# Patient Record
Sex: Female | Born: 1995 | Race: Black or African American | Hispanic: No | Marital: Single | Smoking: Never smoker
Health system: Southern US, Community
[De-identification: ages and names within clinical notes are randomized; demographics above are authoritative.]

## PROBLEM LIST (undated history)

## (undated) DIAGNOSIS — F419 Anxiety disorder, unspecified: Secondary | ICD-10-CM

## (undated) DIAGNOSIS — M329 Systemic lupus erythematosus, unspecified: Secondary | ICD-10-CM

## (undated) DIAGNOSIS — F329 Major depressive disorder, single episode, unspecified: Secondary | ICD-10-CM

## (undated) DIAGNOSIS — F32A Depression, unspecified: Secondary | ICD-10-CM

## (undated) DIAGNOSIS — L659 Nonscarring hair loss, unspecified: Secondary | ICD-10-CM

## (undated) HISTORY — DX: Depression, unspecified: F32.A

## (undated) HISTORY — DX: Nonscarring hair loss, unspecified: L65.9

## (undated) HISTORY — DX: Systemic lupus erythematosus, unspecified: M32.9

## (undated) HISTORY — DX: Major depressive disorder, single episode, unspecified: F32.9

## (undated) HISTORY — PX: ABSCESS DRAINAGE: SHX1119

## (undated) HISTORY — DX: Anxiety disorder, unspecified: F41.9

---
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
## 2013-01-23 ENCOUNTER — Ambulatory Visit (INDEPENDENT_AMBULATORY_CARE_PROVIDER_SITE_OTHER): Payer: BC Managed Care – PPO | Admitting: Physician Assistant

## 2013-01-23 VITALS — HR 86 | Temp 98.4°F | Resp 16

## 2013-01-23 DIAGNOSIS — Z3009 Encounter for other general counseling and advice on contraception: Secondary | ICD-10-CM

## 2013-01-23 MED ORDER — MEDROXYPROGESTERONE ACETATE 150 MG/ML IM SUSP
150.0000 mg | Freq: Once | INTRAMUSCULAR | Status: AC
  Administered 2013-01-23: 150 mg via INTRAMUSCULAR

## 2013-01-23 NOTE — Progress Notes (Signed)
   Patient ID: LARI LINSON MRN: [id number], DOB: 12-03-1995, 17 y.o. Date of Encounter: 01/23/2013, 3:09 PM  Primary Physician: Loreen Freud, DO  Chief Complaint: Here for Depo-Provera injection  17 y.o. year old female here for Depo-Provera injection. On time. Last injection was 10/29/12. Ok to give Depo-Provera injection. Next injection due on 04/10/13-04/24/13. This was a nursing only encounter. No provider/patient encounter occurred today.   Signed, Eula Listen, PA-C [facility] [place], [place] [address] [phone] 01/23/2013 3:09 PM

## 2013-04-19 ENCOUNTER — Ambulatory Visit (INDEPENDENT_AMBULATORY_CARE_PROVIDER_SITE_OTHER): Payer: BC Managed Care – PPO | Admitting: Physician Assistant

## 2013-04-19 DIAGNOSIS — Z309 Encounter for contraceptive management, unspecified: Secondary | ICD-10-CM

## 2013-04-19 DIAGNOSIS — IMO0001 Reserved for inherently not codable concepts without codable children: Secondary | ICD-10-CM

## 2013-04-19 MED ORDER — MEDROXYPROGESTERONE ACETATE 150 MG/ML IM SUSP
150.0000 mg | Freq: Once | INTRAMUSCULAR | Status: AC
  Administered 2013-04-19: 150 mg via INTRAMUSCULAR

## 2013-04-19 NOTE — Progress Notes (Signed)
   Patient ID: Samantha Davies MRN: [id number], DOB: Apr 12, 1995, 18 y.o. Date of Encounter: 04/19/2013, 5:13 PM  Primary Physician: Garnet Koyanagi, DO  Chief Complaint: Here for Depo-Provera injection  18 y.o. female here for Depo-Provera injection. On time. Last injection was 01/23/13. Ok to give Depo-Provera injection. Next injection due on 07/05/13-07/19/13. This was a nursing only encounter. No provider/patient encounter occurred today.   Signed, Christell Faith, MHS, PA-C [facility] [place], [place] [address] [facility] 04/19/2013 5:13 PM

## 2013-07-13 DIAGNOSIS — L932 Other local lupus erythematosus: Secondary | ICD-10-CM | POA: Insufficient documentation

## 2013-09-23 ENCOUNTER — Ambulatory Visit (INDEPENDENT_AMBULATORY_CARE_PROVIDER_SITE_OTHER): Payer: BC Managed Care – PPO | Admitting: Internal Medicine

## 2013-09-23 VITALS — BP 112/62 | HR 87 | Temp 98.3°F | Resp 16 | Ht 61.0 in | Wt 130.2 lb

## 2013-09-23 DIAGNOSIS — L089 Local infection of the skin and subcutaneous tissue, unspecified: Secondary | ICD-10-CM

## 2013-09-23 DIAGNOSIS — R591 Generalized enlarged lymph nodes: Secondary | ICD-10-CM

## 2013-09-23 DIAGNOSIS — L0201 Cutaneous abscess of face: Secondary | ICD-10-CM

## 2013-09-23 DIAGNOSIS — R21 Rash and other nonspecific skin eruption: Secondary | ICD-10-CM

## 2013-09-23 DIAGNOSIS — R599 Enlarged lymph nodes, unspecified: Secondary | ICD-10-CM

## 2013-09-23 DIAGNOSIS — M329 Systemic lupus erythematosus, unspecified: Secondary | ICD-10-CM

## 2013-09-23 DIAGNOSIS — L03211 Cellulitis of face: Secondary | ICD-10-CM

## 2013-09-23 LAB — CBC
HEMATOCRIT: 41.2 % (ref 36.0–46.0)
Hemoglobin: 13.5 g/dL (ref 12.0–15.0)
MCH: 27.6 pg (ref 26.0–34.0)
MCHC: 32.7 g/dL (ref 30.0–36.0)
MCV: 84.4 fL (ref 78.0–100.0)
Platelets: 249 10*3/uL (ref 150–400)
RBC: 4.89 MIL/uL (ref 3.87–5.11)
RDW: 14.2 % (ref 11.5–15.5)
WBC: 8.3 10*3/uL (ref 4.0–10.5)

## 2013-09-23 MED ORDER — MUPIROCIN 2 % EX OINT: 1.0000 "application " | TOPICAL_OINTMENT | Freq: Three times a day (TID) | CUTANEOUS | Status: DC

## 2013-09-23 MED ORDER — DOXYCYCLINE HYCLATE 100 MG PO TABS: 100.0000 mg | ORAL_TABLET | Freq: Two times a day (BID) | ORAL | Status: DC

## 2013-09-23 NOTE — Patient Instructions (Signed)
Cellulitis Cellulitis is an infection of the skin and the tissue beneath it. The infected area is usually red and tender. Cellulitis occurs most often in the arms and lower legs.  CAUSES  Cellulitis is caused by bacteria that enter the skin through cracks or cuts in the skin. The most common types of bacteria that cause cellulitis are Staphylococcus and Streptococcus. SYMPTOMS   Redness and warmth.  Swelling.  Tenderness or pain.  Fever. DIAGNOSIS  Your caregiver can usually determine what is wrong based on a physical exam. Blood tests may also be done. TREATMENT  Treatment usually involves taking an antibiotic medicine. HOME CARE INSTRUCTIONS   Take your antibiotics as directed. Finish them even if you start to feel better.  Keep the infected arm or leg elevated to reduce swelling.  Apply a warm cloth to the affected area up to 4 times per day to relieve pain.  Only take over-the-counter or prescription medicines for pain, discomfort, or fever as directed by your caregiver.  Keep all follow-up appointments as directed by your caregiver. SEEK MEDICAL CARE IF:   You notice red streaks coming from the infected area.  Your red area gets larger or turns dark in color.  Your bone or joint underneath the infected area becomes painful after the skin has healed.  Your infection returns in the same area or another area.  You notice a swollen bump in the infected area.  You develop new symptoms. SEEK IMMEDIATE MEDICAL CARE IF:   You have a fever.  You feel very sleepy.  You develop vomiting or diarrhea.  You have a general ill feeling (malaise) with muscle aches and pains. MAKE SURE YOU:   Understand these instructions.  Will watch your condition.  Will get help right away if you are not doing well or get worse. Document Released: 12/08/2004 Document Revised: 08/30/2011 Document Reviewed: 05/16/2011 [employer] Patient Information 2015 [employer]. This information is  not intended to replace advice given to you by your health care provider. Make sure you discuss any questions you have with your health care provider.  

## 2013-09-23 NOTE — Progress Notes (Signed)
   Subjective:    Patient ID: Samantha Davies, female    DOB: August 16, 1995, 18 y.o.   MRN: [id number]  HPI 18 yr old African American Female is here today with complaints of lumps and swelling inf her face that began 1 1/2 weeks ago. She states she has experienced some body aches  - lower back mainly for the past two to three days. She states she has lupus but presently not in a flare up stage. She states she contacted her lupus specialist at [place] with her current condition and was advised to go to the nearest Urgent Care to be evaluated. She denies any chest pain, shortness of breath or fever. Has past hx of complicated skin infections with hospitalization. Now has multiple sxs with tender lumps under chin, preauricular, and in her breasts. Also swelling comes and goes periorbital and other areas suggestive of revlimid edema. Has active tender pustule on right cheek. Has erythematous macular, non tender rash across breasts , face.    Review of Systems     Objective:   Physical Exam  Constitutional: She is oriented to person, place, and time. She appears well-nourished. She appears distressed.  HENT:  Head: Normocephalic.  Right Ear: External ear normal.  Left Ear: External ear normal.  Nose: Nose normal.  Mouth/Throat: Oropharynx is clear and moist.  Eyes: EOM are normal. Pupils are equal, round, and reactive to light.  Neck: Normal range of motion. Neck supple. No tracheal deviation present. No thyromegaly present.  Cardiovascular: Normal rate, regular rhythm and normal heart sounds.   Pulmonary/Chest: Effort normal and breath sounds normal. She exhibits no tenderness.  Abdominal: Soft. Bowel sounds are normal. She exhibits no mass. There is no hepatosplenomegaly. There is no tenderness.  Musculoskeletal: Normal range of motion.  Lymphadenopathy:       Head (right side): Submental, submandibular and preauricular adenopathy present. No posterior  auricular adenopathy present.       Head (left side): Submental and submandibular adenopathy present. No preauricular, no posterior auricular and no occipital adenopathy present.    She has cervical adenopathy.    She has no axillary adenopathy.       Right: No inguinal and no supraclavicular adenopathy present.       Left: No inguinal and no supraclavicular adenopathy present.  Left posterior submandibular is large , fixed tender  Neurological: She is alert and oriented to person, place, and time. No cranial nerve deficit. She exhibits normal muscle tone. Coordination normal.  Skin: Rash noted. There is erythema.  Psychiatric: She has a normal mood and affect. Her speech is normal and behavior is normal. Judgment and thought content normal. Cognition and memory are normal.  VS are normal Tender preauricular and submental nodes and submandiblar nodes Facial pustular acne and nodule right face.--this was cultured  Cbc machine broken/stat cbc sent out     Assessment & Plan:  Active SLE on revlimid and prednisone Unusual above shoulder adenopathy/rash/edema Pustular lesion right cheek/cultured Doxycycline 100mg  bid See [facility] this week if possible

## 2013-09-24 ENCOUNTER — Ambulatory Visit (INDEPENDENT_AMBULATORY_CARE_PROVIDER_SITE_OTHER): Payer: BC Managed Care – PPO | Admitting: Family Medicine

## 2013-09-24 VITALS — BP 100/74 | HR 86 | Temp 98.2°F | Resp 16 | Ht 61.0 in | Wt 129.8 lb

## 2013-09-24 DIAGNOSIS — Z79899 Other long term (current) drug therapy: Secondary | ICD-10-CM

## 2013-09-24 DIAGNOSIS — L03211 Cellulitis of face: Secondary | ICD-10-CM

## 2013-09-24 DIAGNOSIS — R591 Generalized enlarged lymph nodes: Secondary | ICD-10-CM

## 2013-09-24 DIAGNOSIS — M329 Systemic lupus erythematosus, unspecified: Secondary | ICD-10-CM

## 2013-09-24 DIAGNOSIS — R599 Enlarged lymph nodes, unspecified: Secondary | ICD-10-CM

## 2013-09-24 DIAGNOSIS — L0201 Cutaneous abscess of face: Secondary | ICD-10-CM

## 2013-09-24 LAB — POCT URINALYSIS DIPSTICK
BILIRUBIN UA: NEGATIVE
Blood, UA: NEGATIVE
Glucose, UA: NEGATIVE
Ketones, UA: NEGATIVE
NITRITE UA: NEGATIVE
PH UA: 5
Protein, UA: NEGATIVE
Spec Grav, UA: 1.015
UROBILINOGEN UA: 0.2

## 2013-09-24 LAB — CBC WITH DIFFERENTIAL/PLATELET
BASOS PCT: 0 % (ref 0–1)
Basophils Absolute: 0 10*3/uL (ref 0.0–0.1)
Eosinophils Absolute: 0.1 10*3/uL (ref 0.0–0.7)
Eosinophils Relative: 1 % (ref 0–5)
HEMATOCRIT: 37.7 % (ref 36.0–46.0)
Hemoglobin: 12.9 g/dL (ref 12.0–15.0)
LYMPHS ABS: 0.7 10*3/uL (ref 0.7–4.0)
Lymphocytes Relative: 11 % — ABNORMAL LOW (ref 12–46)
MCH: 26.8 pg (ref 26.0–34.0)
MCHC: 34.2 g/dL (ref 30.0–36.0)
MCV: 78.2 fL (ref 78.0–100.0)
MONO ABS: 0.4 10*3/uL (ref 0.1–1.0)
Monocytes Relative: 7 % (ref 3–12)
Neutro Abs: 5.2 10*3/uL (ref 1.7–7.7)
Neutrophils Relative %: 81 % — ABNORMAL HIGH (ref 43–77)
Platelets: 289 10*3/uL (ref 150–400)
RBC: 4.82 MIL/uL (ref 3.87–5.11)
RDW: 15 % (ref 11.5–15.5)
WBC: 6.4 10*3/uL (ref 4.0–10.5)

## 2013-09-24 LAB — POCT UA - MICROSCOPIC ONLY
Bacteria, U Microscopic: NEGATIVE
CASTS, UR, LPF, POC: NEGATIVE
Crystals, Ur, HPF, POC: NEGATIVE
Mucus, UA: NEGATIVE
RBC, urine, microscopic: NEGATIVE
WBC, Ur, HPF, POC: NEGATIVE
YEAST UA: NEGATIVE

## 2013-09-24 LAB — POCT URINE PREGNANCY: Preg Test, Ur: NEGATIVE

## 2013-09-24 LAB — SEDIMENTATION RATE: Sed Rate: 11 mm/hr (ref 0–22)

## 2013-09-24 NOTE — Progress Notes (Signed)
[facility] [address] [place] [address] [phone]  Date:  09/24/2013   Name:  ANU STAGNER   DOB:  1995-06-11   MRN:  [id number]  PCP:  Garnet Koyanagi, DO    Chief Complaint: Follow-up and Lab Work   History of Present Illness:  KARILYNN CARRANZA is a 18 y.o. very pleasant female patient who presents with the following:  Here today to recheck- she was seen yesterday with cellulitis of her face and tender LAD in her neck and head.  This was cultured and she started on doxycycline.  She notes that her nodes are about the same, maybe a little less painful.  She has not noted any fever or chills.  Eating ok.    Dx with lupus in the 4th grade. She is a pt at [facility] and follows up with them closely.  They have asked her to get some labs- she brings a list with her today.   She is a Ship broker at [employer]. She is studying psychology and sociology  She does not get menses- depo provera Patient Active Problem List   Diagnosis Date Noted  . History of urinary tract infection 05/15/2012  . COMMON MIGRAINE 09/10/2006  . Systemic lupus erythematosus 09/10/2006    Past Medical History  Diagnosis Date  . Lupus     History reviewed. No pertinent past surgical history.  History  Substance Use Topics  . Smoking status: Never Smoker   . Smokeless tobacco: Not on file  . Alcohol Use: No    Family History  Problem Relation Age of Onset  . Lupus Father     No Known Allergies  Medication list has been reviewed and updated.  Current Outpatient Prescriptions on File Prior to Visit  Medication Sig Dispense Refill  . doxycycline (VIBRA-TABS) 100 MG tablet Take 1 tablet (100 mg total) by mouth 2 (two) times daily.  20 tablet  0  . hydroxychloroquine (PLAQUENIL) 200 MG tablet Take 250 mg by mouth daily.      . lansoprazole (PREVACID) 15 MG capsule Take 15 mg by mouth daily.      Marland Kitchen lenalidomide (REVLIMID) 15 MG capsule Take 10 mg by mouth  daily.      . medroxyPROGESTERone (DEPO-PROVERA) 150 MG/ML injection Inject 150 mg into the muscle every 3 (three) months.      . mupirocin ointment (BACTROBAN) 2 % Apply 1 application topically 3 (three) times daily.  22 g  0  . naproxen (NAPROSYN) 250 MG tablet Take 250 mg by mouth daily.      . predniSONE (DELTASONE) 10 MG tablet Take 10 mg by mouth daily.      . SUMAtriptan (IMITREX) 25 MG tablet Take 1 tablet (25 mg total) by mouth every 2 (two) hours as needed for migraine. May repeat in 2 hours if headache persists or recurs.  10 tablet  10   Current Facility-Administered Medications on File Prior to Visit  Medication Dose Route Frequency Provider Last Rate Last Dose  . medroxyPROGESTERone (DEPO-PROVERA) injection 150 mg  150 mg Intramuscular Once Collene Leyden, PA-C        Review of Systems:  As per HPI- otherwise negative.   Physical Examination: Filed Vitals:   09/24/13 1532  BP: 100/74  Pulse: 86  Temp: 98.2 F (36.8 C)  Resp: 16   Filed Vitals:   09/24/13 1532  Height: 5\' 1"  (1.549 m)  Weight: 129 lb 12.8 oz (58.877  kg)   Body mass index is 24.54 kg/(m^2). Ideal Body Weight: Weight in (lb) to have BMI = 25: 132  GEN: WDWN, NAD, Non-toxic, A & O x 3.  Shows signs of her chronic illness (thin hair), but does not appear acutely ill.  There is a healing pimple on her left cheek.  Palpable, slightly tender nodes in her head and neck.   HEENT: Atraumatic, Normocephalic. Neck supple. No masses.  Bilateral TM wnl, oropharynx normal.  PEERL,EOMI.   Ears and Nose: No external deformity. CV: RRR, No M/G/R. No JVD. No thrill. No extra heart sounds. PULM: CTA B, no wheezes, crackles, rhonchi. No retractions. No resp. distress. No accessory muscle use. ABD: S, NT, ND EXTR: No c/c/e NEURO Normal gait.  PSYCH: Normally interactive. Conversant. Not depressed or anxious appearing.  Calm demeanor.    Assessment and Plan: Encounter for long-term (current) use of other  medications - Plan: CBC with Differential, Comprehensive metabolic panel, POCT UA - Microscopic Only, POCT urinalysis dipstick, POCT urine pregnancy, C3 and C4, Sedimentation rate, C-reactive protein, Anti-DNA antibody, double-stranded, Protein / creatinine index, urine  Systemic lupus erythematosus - Plan: CBC with Differential, Comprehensive metabolic panel, POCT UA - Microscopic Only, POCT urinalysis dipstick, POCT urine pregnancy, C3 and C4, Sedimentation rate, C-reactive protein, Anti-DNA antibody, double-stranded, Protein / creatinine index, urine  Cellulitis, face  LAD (lymphadenopathy)  Cellulitis of face appears to be improving.  Continue doxycycline and await would culture.  Ordered labs for her rheumatologist as above.    She will follow-up if any worsening in the meantime.   Signed Lamar Blinks, MD  Call pt at [phone]  Fax results to Dr. Larey Dresser at [phone]- [facility]

## 2013-09-24 NOTE — Patient Instructions (Signed)
I will be in touch with your labs and will also send a copy to your doctor at [facility].  Take care and let us know if you need anything!

## 2013-09-25 ENCOUNTER — Encounter: Payer: Self-pay | Admitting: Family Medicine

## 2013-09-25 LAB — COMPREHENSIVE METABOLIC PANEL
ALBUMIN: 4.6 g/dL (ref 3.5–5.2)
ALK PHOS: 66 U/L (ref 39–117)
ALT: 30 U/L (ref 0–35)
AST: 36 U/L (ref 0–37)
BUN: 14 mg/dL (ref 6–23)
CALCIUM: 9.5 mg/dL (ref 8.4–10.5)
CHLORIDE: 105 meq/L (ref 96–112)
CO2: 21 mEq/L (ref 19–32)
Creat: 0.66 mg/dL (ref 0.50–1.10)
GLUCOSE: 109 mg/dL — AB (ref 70–99)
Potassium: 3.9 mEq/L (ref 3.5–5.3)
Sodium: 137 mEq/L (ref 135–145)
TOTAL PROTEIN: 7.3 g/dL (ref 6.0–8.3)
Total Bilirubin: 0.6 mg/dL (ref 0.2–1.2)

## 2013-09-25 LAB — ANTI-DNA ANTIBODY, DOUBLE-STRANDED: ds DNA Ab: 115 IU/mL — ABNORMAL HIGH

## 2013-09-25 LAB — C3 AND C4
C3 Complement: 106 mg/dL (ref 90–180)
C4 Complement: 8 mg/dL — ABNORMAL LOW (ref 10–40)

## 2013-09-25 LAB — C-REACTIVE PROTEIN: CRP: 0.9 mg/dL — ABNORMAL HIGH (ref <0.60)

## 2013-09-26 LAB — WOUND CULTURE: Gram Stain: NONE SEEN

## 2013-09-28 ENCOUNTER — Telehealth: Payer: Self-pay | Admitting: Family Medicine

## 2013-09-28 NOTE — Telephone Encounter (Signed)
Called and went over her culture results

## 2013-09-30 ENCOUNTER — Telehealth: Payer: Self-pay | Admitting: *Deleted

## 2013-09-30 NOTE — Telephone Encounter (Signed)
Faxed lab work to [facility], per Dr Lorelei Pont. Confirmation page received at 2:24 pm.

## 2013-10-07 ENCOUNTER — Ambulatory Visit (INDEPENDENT_AMBULATORY_CARE_PROVIDER_SITE_OTHER): Payer: BC Managed Care – PPO | Admitting: Physician Assistant

## 2013-10-07 DIAGNOSIS — Z309 Encounter for contraceptive management, unspecified: Secondary | ICD-10-CM

## 2013-10-07 LAB — POCT URINE PREGNANCY: Preg Test, Ur: NEGATIVE

## 2013-10-07 MED ORDER — MEDROXYPROGESTERONE ACETATE 150 MG/ML IM SUSP
150.0000 mg | Freq: Once | INTRAMUSCULAR | Status: AC
Start: 2013-10-07 — End: 2013-10-07
  Administered 2013-10-07: 150 mg via INTRAMUSCULAR

## 2013-10-08 NOTE — Progress Notes (Signed)
18 year old female presents for Depo-provera injection. Last injection here was 04/19/13. She had an appt with her Rheumatologist 3 months ago and had her last injection then. She did not bring in documentation of this, but reports she has not had unprotected sex since then. She is required to be on 2 forms of birth control due to her medications. Urine HCG negative today. Ok to give. Patient seen briefly by provider today.

## 2013-12-26 ENCOUNTER — Ambulatory Visit (INDEPENDENT_AMBULATORY_CARE_PROVIDER_SITE_OTHER): Payer: BC Managed Care – PPO | Admitting: *Deleted

## 2013-12-26 DIAGNOSIS — Z309 Encounter for contraceptive management, unspecified: Secondary | ICD-10-CM

## 2013-12-26 MED ORDER — MEDROXYPROGESTERONE ACETATE 150 MG/ML IM SUSP
150.0000 mg | Freq: Once | INTRAMUSCULAR | Status: AC
  Administered 2013-12-26: 150 mg via INTRAMUSCULAR

## 2014-01-08 ENCOUNTER — Ambulatory Visit (INDEPENDENT_AMBULATORY_CARE_PROVIDER_SITE_OTHER): Payer: BC Managed Care – PPO | Admitting: Family Medicine

## 2014-01-08 VITALS — BP 110/72 | HR 94 | Temp 98.4°F | Resp 16 | Ht 61.0 in | Wt 130.0 lb

## 2014-01-08 DIAGNOSIS — Z79899 Other long term (current) drug therapy: Secondary | ICD-10-CM

## 2014-01-08 DIAGNOSIS — M329 Systemic lupus erythematosus, unspecified: Secondary | ICD-10-CM

## 2014-01-08 DIAGNOSIS — Z23 Encounter for immunization: Secondary | ICD-10-CM

## 2014-01-08 LAB — POCT UA - MICROSCOPIC ONLY
CASTS, UR, LPF, POC: NEGATIVE
Crystals, Ur, HPF, POC: NEGATIVE
MUCUS UA: NEGATIVE
Yeast, UA: NEGATIVE

## 2014-01-08 LAB — POCT URINALYSIS DIPSTICK
BILIRUBIN UA: NEGATIVE
GLUCOSE UA: NEGATIVE
Ketones, UA: NEGATIVE
Nitrite, UA: NEGATIVE
PH UA: 5
Protein, UA: NEGATIVE
RBC UA: NEGATIVE
Spec Grav, UA: 1.015
Urobilinogen, UA: 0.2

## 2014-01-08 LAB — POCT URINE PREGNANCY: PREG TEST UR: NEGATIVE

## 2014-01-08 LAB — POCT SEDIMENTATION RATE: POCT SED RATE: 11 mm/hr (ref 0–22)

## 2014-01-08 NOTE — Progress Notes (Signed)
Subjective:    Patient ID: Samantha Davies, female    DOB: [birth year], 18 y.o.   MRN: [id number] Patient Active Problem List   Diagnosis Date Noted  . History of urinary tract infection 05/15/2012  . COMMON MIGRAINE 09/10/2006  . Systemic lupus erythematosus 09/10/2006   Prior to Admission medications   Medication Sig Start Date End Date Taking? Authorizing Provider  hydroxychloroquine (PLAQUENIL) 200 MG tablet Take 250 mg by mouth daily.   Yes Historical Provider, MD  lenalidomide (REVLIMID) 15 MG capsule Take 10 mg by mouth daily.   Yes Historical Provider, MD  medroxyPROGESTERone (DEPO-PROVERA) 150 MG/ML injection Inject 150 mg into the muscle every 3 (three) months.   Yes Historical Provider, MD  naproxen (NAPROSYN) 250 MG tablet Take 250 mg by mouth daily.   Yes Historical Provider, MD  predniSONE (DELTASONE) 10 MG tablet Take 5 mg by mouth daily.    Yes Historical Provider, MD  ranitidine (ZANTAC) 150 MG capsule Take 150 mg by mouth 2 (two) times daily.   Yes Historical Provider, MD  SUMAtriptan (IMITREX) 25 MG tablet Take 1 tablet (25 mg total) by mouth every 2 (two) hours as needed for migraine. May repeat in 2 hours if headache persists or recurs. 12/17/12  Yes Robyn Haber, MD   HPI  This is a 18 year old female with PMH lupus who is presenting to have labs drawn. She was diagnosed with lupus when she was in the 4th grade. She is seen by [facility] but in the process of switching to adult rheumatology at [facility]. She needs labs drawn in order for her medication to be sent to her. She is not having any problems currently. She reports this year has been "rough" d/t to multiple flares but has not had a flare since May 2015. She has not had the flu shot yet this year.  Review of Systems  Constitutional: Negative for fever and chills.  HENT: Negative.   Respiratory: Negative.   Cardiovascular: Negative.   Gastrointestinal: Negative.   Skin:       Frequent raised  papules  Allergic/Immunologic: Positive for immunocompromised state.      Objective:   Physical Exam  Constitutional: She is oriented to person, place, and time. She appears well-developed and well-nourished. No distress.  HENT:  Head: Normocephalic and atraumatic.  Right Ear: Hearing normal.  Left Ear: Hearing normal.  Nose: Nose normal.  Eyes: Conjunctivae and lids are normal. Right eye exhibits no discharge. Left eye exhibits no discharge. No scleral icterus.  Cardiovascular: Normal rate, regular rhythm and normal pulses.   No murmur heard. Pulmonary/Chest: Effort normal and breath sounds normal. No respiratory distress.  Musculoskeletal: Normal range of motion.  Neurological: She is alert and oriented to person, place, and time.  Skin: Skin is warm, dry and intact.  Psychiatric: She has a normal mood and affect. Her speech is normal and behavior is normal. Thought content normal.   Results for orders placed in visit on 01/08/14  POCT URINALYSIS DIPSTICK      Result Value Ref Range   Color, UA yellow     Clarity, UA clear     Glucose, UA neg     Bilirubin, UA neg     Ketones, UA neg     Spec Grav, UA 1.015     Blood, UA neg     pH, UA 5.0     Protein, UA neg     Urobilinogen, UA 0.2  Nitrite, UA neg     Leukocytes, UA Trace    POCT UA - MICROSCOPIC ONLY      Result Value Ref Range   WBC, Ur, HPF, POC 0-2     RBC, urine, microscopic 0-1     Bacteria, U Microscopic trace     Mucus, UA neg     Epithelial cells, urine per micros 0-1     Crystals, Ur, HPF, POC neg     Casts, Ur, LPF, POC neg     Yeast, UA neg    POCT URINE PREGNANCY      Result Value Ref Range   Preg Test, Ur Negative        Assessment & Plan:  1. Encounter for long-term (current) drug use 2. Lupus  Lupus is stable. She does not have any current complaints. Will fax lab results to [phone] as soon as available.  - CBC with Differential - Comprehensive metabolic panel - POCT urinalysis  dipstick - POCT UA - Microscopic Only - POCT SEDIMENTATION RATE - POCT urine pregnancy - C3 and C4 - Anti-DNA antibody, double-stranded - Protein / Creatinine Ratio, Urine  3. Need for influenza vaccination - Flu Vaccine QUAD 36+ mos IM   Benjaman Pott. Drenda Freeze, MHS [facility]  01/08/2014

## 2014-01-08 NOTE — Patient Instructions (Signed)
I will fax your urine results ASAP. The rest of your results I will fax as soon as they become available.

## 2014-01-09 LAB — COMPREHENSIVE METABOLIC PANEL
ALT: 22 U/L (ref 0–35)
AST: 38 U/L — ABNORMAL HIGH (ref 0–37)
Albumin: 4.6 g/dL (ref 3.5–5.2)
Alkaline Phosphatase: 70 U/L (ref 39–117)
BILIRUBIN TOTAL: 0.6 mg/dL (ref 0.2–1.2)
BUN: 11 mg/dL (ref 6–23)
CO2: 22 mEq/L (ref 19–32)
Calcium: 9.2 mg/dL (ref 8.4–10.5)
Chloride: 108 mEq/L (ref 96–112)
Creat: 0.91 mg/dL (ref 0.50–1.10)
Glucose, Bld: 79 mg/dL (ref 70–99)
Potassium: 3.8 mEq/L (ref 3.5–5.3)
Sodium: 139 mEq/L (ref 135–145)
Total Protein: 7.4 g/dL (ref 6.0–8.3)

## 2014-01-09 LAB — CBC WITH DIFFERENTIAL/PLATELET
BASOS ABS: 0 10*3/uL (ref 0.0–0.1)
Basophils Relative: 0 % (ref 0–1)
Eosinophils Absolute: 0.1 10*3/uL (ref 0.0–0.7)
Eosinophils Relative: 3 % (ref 0–5)
HCT: 38.4 % (ref 36.0–46.0)
Hemoglobin: 13.3 g/dL (ref 12.0–15.0)
Lymphocytes Relative: 21 % (ref 12–46)
Lymphs Abs: 0.8 10*3/uL (ref 0.7–4.0)
MCH: 27.7 pg (ref 26.0–34.0)
MCHC: 34.6 g/dL (ref 30.0–36.0)
MCV: 80 fL (ref 78.0–100.0)
Monocytes Absolute: 0.3 10*3/uL (ref 0.1–1.0)
Monocytes Relative: 7 % (ref 3–12)
Neutro Abs: 2.5 10*3/uL (ref 1.7–7.7)
Neutrophils Relative %: 69 % (ref 43–77)
PLATELETS: 233 10*3/uL (ref 150–400)
RBC: 4.8 MIL/uL (ref 3.87–5.11)
RDW: 15.6 % — AB (ref 11.5–15.5)
WBC: 3.6 10*3/uL — ABNORMAL LOW (ref 4.0–10.5)

## 2014-01-09 LAB — PROTEIN / CREATININE RATIO, URINE
CREATININE, URINE: 137.3 mg/dL
Protein Creatinine Ratio: 0.07 (ref <0.15)
Total Protein, Urine: 10 mg/dL (ref 5–24)

## 2014-01-09 NOTE — Progress Notes (Signed)
Patient discussed with Ms. Bush. Agree with assessment and plan of care per her note.   

## 2014-01-10 LAB — C3 AND C4
C3 COMPLEMENT: 92 mg/dL (ref 90–180)
C4 Complement: 9 mg/dL — ABNORMAL LOW (ref 10–40)

## 2014-01-10 LAB — ANTI-DNA ANTIBODY, DOUBLE-STRANDED: DS DNA AB: 111 [IU]/mL — AB

## 2014-03-19 ENCOUNTER — Ambulatory Visit (INDEPENDENT_AMBULATORY_CARE_PROVIDER_SITE_OTHER): Payer: BC Managed Care – PPO | Admitting: Radiology

## 2014-03-19 DIAGNOSIS — Z3042 Encounter for surveillance of injectable contraceptive: Secondary | ICD-10-CM

## 2014-03-19 MED ORDER — MEDROXYPROGESTERONE ACETATE 150 MG/ML IM SUSP
150.0000 mg | Freq: Once | INTRAMUSCULAR | Status: AC
  Administered 2014-03-19: 150 mg via INTRAMUSCULAR

## 2014-03-26 ENCOUNTER — Encounter: Payer: Self-pay | Admitting: Nurse Practitioner

## 2014-03-26 ENCOUNTER — Ambulatory Visit (INDEPENDENT_AMBULATORY_CARE_PROVIDER_SITE_OTHER): Payer: BC Managed Care – PPO | Admitting: Nurse Practitioner

## 2014-03-26 VITALS — BP 100/60 | HR 100 | Temp 98.7°F | Resp 20 | Ht 61.0 in | Wt 129.0 lb

## 2014-03-26 DIAGNOSIS — Z30014 Encounter for initial prescription of intrauterine contraceptive device: Secondary | ICD-10-CM

## 2014-03-26 DIAGNOSIS — Z113 Encounter for screening for infections with a predominantly sexual mode of transmission: Secondary | ICD-10-CM

## 2014-03-26 DIAGNOSIS — Z01419 Encounter for gynecological examination (general) (routine) without abnormal findings: Secondary | ICD-10-CM

## 2014-03-26 NOTE — Patient Instructions (Signed)
General topics  Next pap or exam is  due in 1 year Take a Women's multivitamin Take 1200 mg. of calcium daily - prefer dietary If any concerns in interim to call back  Breast Self-Awareness Practicing breast self-awareness may pick up problems early, prevent significant medical complications, and possibly save your life. By practicing breast self-awareness, you can become familiar with how your breasts look and feel and if your breasts are changing. This allows you to notice changes early. It can also offer you some reassurance that your breast health is good. One way to learn what is normal for your breasts and whether your breasts are changing is to do a breast self-exam. If you find a lump or something that was not present in the past, it is best to contact your caregiver right away. Other findings that should be evaluated by your caregiver include nipple discharge, especially if it is bloody; skin changes or reddening; areas where the skin seems to be pulled in (retracted); or new lumps and bumps. Breast pain is seldom associated with cancer (malignancy), but should also be evaluated by a caregiver. BREAST SELF-EXAM The best time to examine your breasts is 5 7 days after your menstrual period is over.  ExitCare Patient Information 2013 ExitCare, LLC.   Exercise to Stay Healthy Exercise helps you become and stay healthy. EXERCISE IDEAS AND TIPS Choose exercises that:  You enjoy.  Fit into your day. You do not need to exercise really hard to be healthy. You can do exercises at a slow or medium level and stay healthy. You can:  Stretch before and after working out.  Try yoga, Pilates, or tai chi.  Lift weights.  Walk fast, swim, jog, run, climb stairs, bicycle, dance, or rollerskate.  Take aerobic classes. Exercises that burn about 150 calories:  Running 1  miles in 15 minutes.  Playing volleyball for 45 to 60 minutes.  Washing and waxing a car for 45 to 60  minutes.  Playing touch football for 45 minutes.  Walking 1  miles in 35 minutes.  Pushing a stroller 1  miles in 30 minutes.  Playing basketball for 30 minutes.  Raking leaves for 30 minutes.  Bicycling 5 miles in 30 minutes.  Walking 2 miles in 30 minutes.  Dancing for 30 minutes.  Shoveling snow for 15 minutes.  Swimming laps for 20 minutes.  Walking up stairs for 15 minutes.  Bicycling 4 miles in 15 minutes.  Gardening for 30 to 45 minutes.  Jumping rope for 15 minutes.  Washing windows or floors for 45 to 60 minutes. Document Released: 04/02/2010 Document Revised: 05/23/2011 Document Reviewed: 04/02/2010 ExitCare Patient Information 2013 ExitCare[employer].   Other topics ( that may be useful information):    Sexually Transmitted Disease Sexually transmitted disease (STD) refers to any infection that is passed from person to person during sexual activity. This may happen by way of saliva, semen, blood, vaginal mucus, or urine. Common STDs include:  Gonorrhea.  Chlamydia.  Syphilis.  HIV/AIDS.  Genital herpes.  Hepatitis B and C.  Trichomonas.  Human papillomavirus (HPV).  Pubic lice. CAUSES  An STD may be spread by bacteria, virus, or parasite. A person can get an STD by:  Sexual intercourse with an infected person.  Sharing sex toys with an infected person.  Sharing needles with an infected person.  Having intimate contact with the genitals, mouth, or rectal areas of an infected person. SYMPTOMS  Some people may not have any symptoms, but   they can still pass the infection to others. Different STDs have different symptoms. Symptoms include:  Painful or bloody urination.  Pain in the pelvis, abdomen, vagina, anus, throat, or eyes.  Skin rash, itching, irritation, growths, or sores (lesions). These usually occur in the genital or anal area.  Abnormal vaginal discharge.  Penile discharge in men.  Soft, flesh-colored skin growths in the  genital or anal area.  Fever.  Pain or bleeding during sexual intercourse.  Swollen glands in the groin area.  Yellow skin and eyes (jaundice). This is seen with hepatitis. DIAGNOSIS  To make a diagnosis, your caregiver may:  Take a medical history.  Perform a physical exam.  Take a specimen (culture) to be examined.  Examine a sample of discharge under a microscope.  Perform blood test TREATMENT   Chlamydia, gonorrhea, trichomonas, and syphilis can be cured with antibiotic medicine.  Genital herpes, hepatitis, and HIV can be treated, but not cured, with prescribed medicines. The medicines will lessen the symptoms.  Genital warts from HPV can be treated with medicine or by freezing, burning (electrocautery), or surgery. Warts may come back.  HPV is a virus and cannot be cured with medicine or surgery.However, abnormal areas may be followed very closely by your caregiver and may be removed from the cervix, vagina, or vulva through office procedures or surgery. If your diagnosis is confirmed, your recent sexual partners need treatment. This is true even if they are symptom-free or have a negative culture or evaluation. They should not have sex until their caregiver says it is okay. HOME CARE INSTRUCTIONS  All sexual partners should be informed, tested, and treated for all STDs.  Take your antibiotics as directed. Finish them even if you start to feel better.  Only take over-the-counter or prescription medicines for pain, discomfort, or fever as directed by your caregiver.  Rest.  Eat a balanced diet and drink enough fluids to keep your urine clear or pale yellow.  Do not have sex until treatment is completed and you have followed up with your caregiver. STDs should be checked after treatment.  Keep all follow-up appointments, Pap tests, and blood tests as directed by your caregiver.  Only use latex condoms and water-soluble lubricants during sexual activity. Do not use  petroleum jelly or oils.  Avoid alcohol and illegal drugs.  Get vaccinated for HPV and hepatitis. If you have not received these vaccines in the past, talk to your caregiver about whether one or both might be right for you.  Avoid risky sex practices that can break the skin. The only way to avoid getting an STD is to avoid all sexual activity.Latex condoms and dental dams (for oral sex) will help lessen the risk of getting an STD, but will not completely eliminate the risk. SEEK MEDICAL CARE IF:   You have a fever.  You have any new or worsening symptoms. Document Released: 05/21/2002 Document Revised: 05/23/2011 Document Reviewed: 05/28/2010 [facility] Patient Information 2013 [place].    Domestic Abuse You are being battered or abused if someone close to you hits, pushes, or physically hurts you in any way. You also are being abused if you are forced into activities. You are being sexually abused if you are forced to have sexual contact of any kind. You are being emotionally abused if you are made to feel worthless or if you are constantly threatened. It is important to remember that help is available. No one has the right to abuse you. PREVENTION OF FURTHER  ABUSE  Learn the warning signs of danger. This varies with situations but may include: the use of alcohol, threats, isolation from friends and family, or forced sexual contact. Leave if you feel that violence is going to occur.  If you are attacked or beaten, report it to the police so the abuse is documented. You do not have to press charges. The police can protect you while you or the attackers are leaving. Get the officer's name and badge number and a copy of the report.  Find someone you can trust and tell them what is happening to you: your caregiver, a nurse, clergy member, close friend or family member. Feeling ashamed is natural, but remember that you have done nothing wrong. No one deserves abuse. Document Released:  02/26/2000 Document Revised: 05/23/2011 Document Reviewed: 05/06/2010 ExitCare Patient Information 2013 ExitCare[employer] LLC.    How Much is Too Much Alcohol? Drinking too much alcohol can cause injury, accidents, and health problems. These types of problems can include:   Car crashes.  Falls.  Family fighting (domestic violence).  Drowning.  Fights.  Injuries.  Burns.  Damage to certain organs.  Having a baby with birth defects. ONE DRINK CAN BE TOO MUCH WHEN YOU ARE:  Working.  Pregnant or breastfeeding.  Taking medicines. Ask your doctor.  Driving or planning to drive. If you or someone you know has a drinking problem, get help from a doctor.  Document Released: 12/25/2008 Document Revised: 05/23/2011 Document Reviewed: 12/25/2008 ExitCare Patient Information 2013 [employer]Care[employer].   Smoking Hazards Smoking cigarettes is extremely bad for your health. Tobacco smoke has over 200 known poisons in it. There are over 60 chemicals in tobacco smoke that cause cancer. Some of the chemicals found in cigarette smoke include:   Cyanide.  Benzene.  Formaldehyde.  Methanol (wood alcohol).  Acetylene (fuel used in welding torches).  Ammonia. Cigarette smoke also contains the poisonous gases nitrogen oxide and carbon monoxide.  Cigarette smokers have an increased risk of many serious medical problems and Smoking causes approximately:  90% of all lung cancer deaths in men.  80% of all lung cancer deaths in women.  90% of deaths from chronic obstructive lung disease. Compared with nonsmokers, smoking increases the risk of:  Coronary heart disease by 2 to 4 times.  Stroke by 2 to 4 times.  Men developing lung cancer by 23 times.  Women developing lung cancer by 13 times.  Dying from chronic obstructive lung diseases by 12 times.  . Smoking is the most preventable cause of death and disease in our society.  WHY IS SMOKING ADDICTIVE?  Nicotine is the chemical  agent in tobacco that is capable of causing addiction or dependence.  When you smoke and inhale, nicotine is absorbed rapidly into the bloodstream through your lungs. Nicotine absorbed through the lungs is capable of creating a powerful addiction. Both inhaled and non-inhaled nicotine may be addictive.  Addiction studies of cigarettes and spit tobacco show that addiction to nicotine occurs mainly during the teen years, when young people begin using tobacco products. WHAT ARE THE BENEFITS OF QUITTING?  There are many health benefits to quitting smoking.   Likelihood of developing cancer and heart disease decreases. Health improvements are seen almost immediately.  Blood pressure, pulse rate, and breathing patterns start returning to normal soon after quitting. QUITTING SMOKING   American Lung Association - 1-800-LUNGUSA  American Cancer Society - 1-800-ACS-2345 Document Released: 04/07/2004 Document Revised: 05/23/2011 Document Reviewed: 12/10/2008 ExitCare Patient Information 2013 ExitCare,   LLC.   Stress Management Stress is a state of physical or mental tension that often results from changes in your life or normal routine. Some common causes of stress are:  Death of a loved one.  Injuries or severe illnesses.  Getting fired or changing jobs.  Moving into a new home. Other causes may be:  Sexual problems.  Business or financial losses.  Taking on a large debt.  Regular conflict with someone at home or at work.  Constant tiredness from lack of sleep. It is not just bad things that are stressful. It may be stressful to:  Win the lottery.  Get married.  Buy a new car. The amount of stress that can be easily tolerated varies from person to person. Changes generally cause stress, regardless of the types of change. Too much stress can affect your health. It may lead to physical or emotional problems. Too little stress (boredom) may also become stressful. SUGGESTIONS TO  REDUCE STRESS:  Talk things over with your family and friends. It often is helpful to share your concerns and worries. If you feel your problem is serious, you may want to get help from a professional counselor.  Consider your problems one at a time instead of lumping them all together. Trying to take care of everything at once may seem impossible. List all the things you need to do and then start with the most important one. Set a goal to accomplish 2 or 3 things each day. If you expect to do too many in a single day you will naturally fail, causing you to feel even more stressed.  Do not use alcohol or drugs to relieve stress. Although you may feel better for a short time, they do not remove the problems that caused the stress. They can also be habit forming.  Exercise regularly - at least 3 times per week. Physical exercise can help to relieve that "uptight" feeling and will relax you.  The shortest distance between despair and hope is often a good night's sleep.  Go to bed and get up on time allowing yourself time for appointments without being rushed.  Take a short "time-out" period from any stressful situation that occurs during the day. Close your eyes and take some deep breaths. Starting with the muscles in your face, tense them, hold it for a few seconds, then relax. Repeat this with the muscles in your neck, shoulders, hand, stomach, back and legs.  Take good care of yourself. Eat a balanced diet and get plenty of rest.  Schedule time for having fun. Take a break from your daily routine to relax. HOME CARE INSTRUCTIONS   Call if you feel overwhelmed by your problems and feel you can no longer manage them on your own.  Return immediately if you feel like hurting yourself or someone else. Document Released: 08/24/2000 Document Revised: 05/23/2011 Document Reviewed: 04/16/2007 ExitCare Patient Information 2013 ExitCare, LLC.  

## 2014-03-26 NOTE — Progress Notes (Signed)
19 y.o. G0 Single  African American Fe here for annual exam.  Menses 1 year ago.  Lasting  4-5 days. Flow heavy for 2 days then lighter. Some cramps 1 week before cycle. No PMS.  On Depo for a year.  Years ago X 2 years.  Then went to [facility] for 6 months.  Compliance was issue. So went back on Depo.  Now wants to consider Skyla IUD.  Last Depo 1 week ago. Same partner for 3 years.  History of Lupus since the 4th grade and on long term steroids.  She has been followed for years by [facility].    In college psychology major at [employer]  Patient's last menstrual period was 03/14/2013.          Sexually active: No.  The current method of family planning is Depo-Provera injections.    Exercising: Yes.    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 2013 Labs: PCP   reports that she has never smoked. She has never used smokeless tobacco. She reports that she does not drink alcohol or use illicit drugs.  Past Medical History  Diagnosis Date  . Lupus 2005    History reviewed. No pertinent past surgical history.  Current Outpatient Prescriptions  Medication Sig Dispense Refill  . FLUOCINOLONE ACETONIDE BODY 0.01 % OIL Apply topically.    . hydroxychloroquine (PLAQUENIL) 200 MG tablet Take 250 mg by mouth daily.    Marland Kitchen lenalidomide (REVLIMID) 15 MG capsule Take 10 mg by mouth daily.    . medroxyPROGESTERone (DEPO-PROVERA) 150 MG/ML injection Inject 150 mg into the muscle every 3 (three) months.    . naproxen (NAPROSYN) 250 MG tablet Take 250 mg by mouth 2 (two) times daily with a meal.     . predniSONE (DELTASONE) 10 MG tablet Take 5 mg by mouth daily.     . SUMAtriptan (IMITREX) 25 MG tablet Take 1 tablet (25 mg total) by mouth every 2 (two) hours as needed for migraine. May repeat in 2 hours if headache persists or recurs. 10 tablet 10   Current Facility-Administered Medications  Medication Dose Route Frequency Provider Last Rate Last Dose  .  medroxyPROGESTERone (DEPO-PROVERA) injection 150 mg  150 mg Intramuscular Once Collene Leyden, PA-C        Family History  Problem Relation Age of Onset  . Lupus Father     ROS:  Pertinent items are noted in HPI.  Otherwise, a comprehensive ROS was negative.  Exam:   BP 100/60 mmHg  Pulse 100  Temp(Src) 98.7 F (37.1 C) (Oral)  Resp 20  Ht 5\' 1"  (1.549 m)  Wt 129 lb (58.514 kg)  BMI 24.39 kg/m2  LMP 03/14/2013 Height: 5\' 1"  (154.9 cm) Ht Readings from Last 3 Encounters:  03/26/14 5\' 1"  (1.549 m)  01/08/14 5\' 1"  (1.549 m)  09/24/13 5\' 1"  (1.549 m)    General appearance: alert, cooperative and appears stated age Head: Normocephalic, without obvious abnormality, atraumatic Neck: no adenopathy, supple, symmetrical, trachea midline and thyroid normal to inspection and palpation Lungs: clear to auscultation bilaterally Breasts: normal appearance, no masses or tenderness Heart: regular rate and rhythm Abdomen: soft, non-tender; no masses,  no organomegaly Extremities: extremities normal, atraumatic, no cyanosis or edema Skin: Skin color, texture, turgor normal. Multiple areas of rash, lesions on both arms and elbows from the Lupus.  Nail beds of 3 fingers are discolored from Lupus. Lymph nodes: Cervical,  supraclavicular, and axillary nodes normal. No abnormal inguinal nodes palpated Neurologic: Grossly normal   Pelvic: External genitalia:  no lesions              Urethra:  normal appearing urethra with no masses, tenderness or lesions              Bartholin's and Skene's: normal                 Vagina: normal appearing vagina with normal color and discharge, no lesions              Cervix: anteverted              Pap taken: No. Bimanual Exam:  Uterus:  normal size, contour, position, consistency, mobility, non-tender              Adnexa: no mass, fullness, tenderness               Rectovaginal: Confirms               Anus:  normal sphincter tone, no lesions  Chaperone  present: Yes  A:  Well Woman with normal exam  Depo provera for contraception  R/O STD's  History of Lupus with multiple skin and nail lesions  P:   Reviewed health and wellness pertinent to exam  Pap smear not taken today  Information about Skyla IUD  Follow up with STD's and [facility]  Will need to return for consult visit - her Depo is current and does not need apt.  right away. She has anxiety issues and may need premedication   Counseled on breast self exam, STD prevention, family planning choices return annually or prn  An After Visit Summary was printed and given to the patient.

## 2014-03-27 ENCOUNTER — Telehealth: Payer: Self-pay | Admitting: *Deleted

## 2014-03-27 ENCOUNTER — Other Ambulatory Visit: Payer: Self-pay | Admitting: Nurse Practitioner

## 2014-03-27 LAB — STD PANEL
HEP B S AG: NEGATIVE
HIV 1&2 Ab, 4th Generation: NONREACTIVE

## 2014-03-27 LAB — WET PREP BY MOLECULAR PROBE
CANDIDA SPECIES: POSITIVE — AB
Gardnerella vaginalis: POSITIVE — AB
Trichomonas vaginosis: NEGATIVE

## 2014-03-27 LAB — IPS N GONORRHOEA AND CHLAMYDIA BY PCR

## 2014-03-27 MED ORDER — METRONIDAZOLE 0.75 % VA GEL: 1.0000 | Freq: Every day | VAGINAL | Status: DC

## 2014-03-27 MED ORDER — FLUCONAZOLE 150 MG PO TABS: 150.0000 mg | ORAL_TABLET | Freq: Once | ORAL | Status: DC

## 2014-03-27 NOTE — Telephone Encounter (Signed)
-----   Message from Milford Cage, [place] sent at 03/27/2014  8:34 AM EST ----- Please let patient know that she has a mixed vaginitis with both yeast and BV.  Rx will be sent to her pharmacy.  She is to take 2 diflucan as directed along with Metrogel vag cream.  Then a few days after the Metrogel she will have a refill on Diflucan if needed to take after the antibiotics.  The STD's that are back are negative included is HIV, Hep B, STS.  [facility] & [facility] are pending and will send them later.

## 2014-03-27 NOTE — Telephone Encounter (Signed)
I have attempted to contact this patient by phone with the following results: left message to return call to [employer] at [phone] on answering machine (mobile per [facility]).  Name verified in [place].  Advised call was regarding labs.  [phone] (Mobile) *Preferred*

## 2014-03-28 ENCOUNTER — Telehealth: Payer: Self-pay

## 2014-03-28 NOTE — Telephone Encounter (Signed)
Spoke with patient. Results given. Patient is agreeable.  Routing to provider for final review. Patient agreeable to disposition. Will close encounter

## 2014-03-28 NOTE — Telephone Encounter (Signed)
Spoke with patient after phone call was passed from [place]. Patient is scheduled for consult appointment for IUD with Dr.Silva on 3/4. Patient states that she does not currently have cycle with Depo. Advised patient will need to stay up to date with Depo injection and discuss scheduling with Dr.Silva at consult appointment. Patient is agreeable. Advised patient of results as seen below.Patient is agreeable and verbalizes understanding.  Cc: Abelino Derrick  Routing to provider for final review. Patient agreeable to disposition. Will close encounter

## 2014-03-28 NOTE — Telephone Encounter (Signed)
Spoke with patient. Advised that per benefits quote received, IUD and insertion is covered at 100%. There will be 0 patient liability. Patient is to call within the first 5 days of her cycle to schedule insertion. Passed call to Triage to schedule consult.

## 2014-03-28 NOTE — Telephone Encounter (Signed)
-----   Message from Milford Cage, [place] sent at 03/27/2014  6:17 PM EST ----- Let patient know that GC and CHL is negative

## 2014-03-30 NOTE — Progress Notes (Signed)
Encounter reviewed by Dr. [place] Voytko Silva.  

## 2014-05-16 ENCOUNTER — Ambulatory Visit (INDEPENDENT_AMBULATORY_CARE_PROVIDER_SITE_OTHER): Payer: BC Managed Care – PPO | Admitting: Obstetrics and Gynecology

## 2014-05-16 ENCOUNTER — Encounter: Payer: Self-pay | Admitting: Obstetrics and Gynecology

## 2014-05-16 VITALS — BP 100/64 | HR 80 | Ht 61.0 in | Wt 130.4 lb

## 2014-05-16 DIAGNOSIS — Z308 Encounter for other contraceptive management: Secondary | ICD-10-CM | POA: Diagnosis not present

## 2014-05-16 NOTE — Progress Notes (Signed)
Patient ID: Samantha Davies, female   DOB: [birth year], 19 y.o.   MRN: [id number] GYNECOLOGY  VISIT   HPI: 19 y.o.   Single  African American  female   G0P0 with No LMP recorded. Patient has had an injection.   here for consultation for [facility] IUD.  Using Depo Provera.  Stopped her cycles.  Has cramps when her cycles are due.   Used Oral contraceptives in the past.   Patient had negative GC/CT 03-25-14 and negative STD panel at the same time.  Patient states she is not currently sexually active.  Treated for BV and yeast recently.  Metrogel and Diflucan.   GYNECOLOGIC HISTORY: No LMP recorded. Patient has had an injection. Contraception: Depo Provera injection   Menopausal hormone therapy: n/a        OB History    Gravida Para Term Preterm AB TAB SAB Ectopic Multiple Living   0                  Patient Active Problem List   Diagnosis Date Noted  . History of urinary tract infection 05/15/2012  . COMMON MIGRAINE 09/10/2006  . Systemic lupus erythematosus 09/10/2006    Past Medical History  Diagnosis Date  . Lupus 2005    History reviewed. No pertinent past surgical history.  Current Outpatient Prescriptions  Medication Sig Dispense Refill  . cholecalciferol (VITAMIN D) 1000 UNITS tablet Take 1,000 Units by mouth 3 (three) times daily.    Marland Kitchen FLUOCINOLONE ACETONIDE BODY 0.01 % OIL Apply topically as needed.     . hydroxychloroquine (PLAQUENIL) 200 MG tablet Take 200 mg by mouth daily. Takes 1 1/2 tablet daily -- total of 300mg     . lenalidomide (REVLIMID) 15 MG capsule Take 10 mg by mouth daily.    . medroxyPROGESTERone (DEPO-PROVERA) 150 MG/ML injection Inject 150 mg into the muscle every 3 (three) months.    . predniSONE (DELTASONE) 10 MG tablet Take 10 mg by mouth daily.     . ranitidine (ZANTAC) 150 MG capsule Take 150 mg by mouth 3 (three) times daily.    Marland Kitchen REVLIMID 10 MG capsule     . SUMAtriptan (IMITREX) 25 MG tablet Take 1 tablet (25 mg total) by mouth every 2  (two) hours as needed for migraine. May repeat in 2 hours if headache persists or recurs. 10 tablet 10   Current Facility-Administered Medications  Medication Dose Route Frequency Provider Last Rate Last Dose  . medroxyPROGESTERone (DEPO-PROVERA) injection 150 mg  150 mg Intramuscular Once [employer], PA-C         ALLERGIES: Review of patient's allergies indicates no known allergies.  Family History  Problem Relation Age of Onset  . Lupus Father     History   Social History  . Marital Status: Single    Spouse Name: N/A  . Number of Children: N/A  . Years of Education: N/A   Occupational History  . Not on file.   Social History Main Topics  . Smoking status: Never Smoker   . Smokeless tobacco: Never Used  . Alcohol Use: No  . Drug Use: No  . Sexual Activity:    Partners: Male    Birth Control/ Protection: Injection   Other Topics Concern  . Not on file   Social History Narrative    ROS:  Pertinent items are noted in HPI.  PHYSICAL EXAMINATION:    BP 100/64 mmHg  Pulse 80  Ht 5\' 1"  (1.549 m)  Wt 130  lb 6.4 oz (59.149 kg)  BMI 24.65 kg/m2     General appearance: alert, cooperative and appears stated age  Pelvic: External genitalia:  no lesions              Urethra:  normal appearing urethra with no masses, tenderness or lesions              Bartholins and Skenes: normal                 Vagina: normal appearing vagina with normal color and discharge, no lesions              Cervix: normal appearance                   Bimanual Exam:  Uterus:  uterus is normal size, shape, consistency and nontender                                      Adnexa: normal adnexa in size, nontender and no masses                                       ASSESSMENT  Desire for IUD. Lupus.  History of migraines.   PLAN  I had a comprehensive discussion of IUDs - Skyla and PraGard - risks and benefits reviewed.  In addition to general discussion and side effects of IUDs, I  reviewed risks of ovarian cyst formation, ectopic pregnancy, uterine perforation and malposition, expulsion, and retrieval of IUD under ultrasound guidance and in the OR if necessary.  I also reviewed Depo Provera as a long acting method of contraception.  Patient desires to proceed with [facility] precertification and insertion.  I discussed use of Cytotec 200 mcg the night prior to procedure and the am of the procedure.  I discussed paracervical block.     An After Visit Summary was printed and given to the patient.  ___25___ minutes face to face time of which over 50% was spent in counseling.

## 2014-05-20 ENCOUNTER — Telehealth: Payer: Self-pay | Admitting: Obstetrics and Gynecology

## 2014-05-20 MED ORDER — MISOPROSTOL 200 MCG PO TABS: ORAL_TABLET | ORAL | Status: DC

## 2014-05-20 NOTE — Telephone Encounter (Signed)
Spoke with patient. Phone call passed from [facility]. Patient would like to schedule IUD insertion at this time. Patient is currently using Depo for birth control. Last depo was given 03/19/2014. Appointment scheduled for IUD insertion on 4/1 at 10am with Dr.Silva. Patient is agreeable to date and time. Pre procedure instructions given.  Motrin instructions given. Motrin=Advil=Ibuprofen, 800 mg one hour before appointment. Eat a meal and hydrate well before appointment. Cytotec instructions given. Take one tablet the night before procedure and one tablet the morning of procedure. Cytotec 258mcg #2 0RF sent to [facility] on file.  Routing to provider for final review. Patient agreeable to disposition. Will close encounter

## 2014-05-20 NOTE — Telephone Encounter (Signed)
Spoke with patient. Went over IUD benefits. Patient agreeable. Passed call to [facility] for scheduling and instructions.

## 2014-06-12 ENCOUNTER — Telehealth: Payer: Self-pay | Admitting: Obstetrics and Gynecology

## 2014-06-12 MED ORDER — MISOPROSTOL 200 MCG PO TABS: ORAL_TABLET | ORAL | Status: DC

## 2014-06-12 NOTE — Telephone Encounter (Signed)
Patient went to her pharmacy (on file) and was told her prescription was not called in. Patient needs this prescription today for her IUD insertion appointment tomorrow. Confirmed pharmacy on file.

## 2014-06-12 NOTE — Telephone Encounter (Signed)
Spoke with patient. Confirmed pharmacy on file and order resent to [facility] for Take Cytotec 200 mcg tablet. 1 tablet PO night before the procedure. 1 tablet PO the morning of the procedure.    Patient verbalized understanding of instructions and is scheduled for IUD insertion 06/13/14.  Routing to provider for final review. Patient agreeable to disposition. Will close encounter

## 2014-06-13 ENCOUNTER — Ambulatory Visit (INDEPENDENT_AMBULATORY_CARE_PROVIDER_SITE_OTHER): Payer: BC Managed Care – PPO | Admitting: Obstetrics and Gynecology

## 2014-06-13 ENCOUNTER — Encounter: Payer: Self-pay | Admitting: Obstetrics and Gynecology

## 2014-06-13 ENCOUNTER — Ambulatory Visit (HOSPITAL_COMMUNITY)
Admission: RE | Admit: 2014-06-13 | Discharge: 2014-06-13 | Disposition: A | Discharge status: Home / Self Care | Payer: BC Managed Care – PPO | Source: Ambulatory Visit | Attending: Obstetrics and Gynecology | Admitting: Obstetrics and Gynecology

## 2014-06-13 VITALS — BP 110/70 | HR 84 | Ht 61.0 in | Wt 129.8 lb

## 2014-06-13 DIAGNOSIS — Z308 Encounter for other contraceptive management: Secondary | ICD-10-CM | POA: Diagnosis not present

## 2014-06-13 DIAGNOSIS — R102 Pelvic and perineal pain: Secondary | ICD-10-CM | POA: Insufficient documentation

## 2014-06-13 DIAGNOSIS — M25559 Pain in unspecified hip: Secondary | ICD-10-CM

## 2014-06-13 DIAGNOSIS — Z30014 Encounter for initial prescription of intrauterine contraceptive device: Secondary | ICD-10-CM

## 2014-06-13 DIAGNOSIS — Z30431 Encounter for routine checking of intrauterine contraceptive device: Secondary | ICD-10-CM | POA: Insufficient documentation

## 2014-06-13 DIAGNOSIS — Z30430 Encounter for insertion of intrauterine contraceptive device: Secondary | ICD-10-CM

## 2014-06-13 LAB — POCT URINE PREGNANCY: Preg Test, Ur: NEGATIVE

## 2014-06-13 MED ORDER — HYDROCODONE-ACETAMINOPHEN 5-300 MG PO TABS: 1.0000 | ORAL_TABLET | ORAL | Status: DC | PRN

## 2014-06-13 NOTE — Addendum Note (Signed)
Addended by: Yisroel Ramming, BROOK E on: 06/13/2014 11:41 AM   Modules accepted: Orders

## 2014-06-13 NOTE — Progress Notes (Addendum)
Patient ID: Samantha Davies, female   DOB: December 10, 1995, 19 y.o.   MRN: [id number] GYNECOLOGY  VISIT   HPI: 19 y.o.   Single  African American  female   G0P0 with No LMP recorded. Patient has had an injection.   here for [facility] IUD insertion.   Currently using Depo Provera for contraception.  No cycles.  Last intercourse __3/26/16_______. Last Depo Provera given 03/19/14 at [facility].   Was due for Depo 8 days ago.   Took Cytotec last night and this am.   UPT - negative today.   GYNECOLOGIC HISTORY: No LMP recorded. Patient has had an injection. Contraception: Depo Provera injection  Menopausal hormone therapy: n/a        OB History    Gravida Para Term Preterm AB TAB SAB Ectopic Multiple Living   0                  Patient Active Problem List   Diagnosis Date Noted  . History of urinary tract infection 05/15/2012  . COMMON MIGRAINE 09/10/2006  . Systemic lupus erythematosus 09/10/2006    Past Medical History  Diagnosis Date  . Lupus 2005    History reviewed. No pertinent past surgical history.  Current Outpatient Prescriptions  Medication Sig Dispense Refill  . cholecalciferol (VITAMIN D) 1000 UNITS tablet Take 1,000 Units by mouth 3 (three) times daily.    Marland Kitchen FLUOCINOLONE ACETONIDE BODY 0.01 % OIL Apply topically as needed.     . hydroxychloroquine (PLAQUENIL) 200 MG tablet     . misoprostol (CYTOTEC) 200 MCG tablet Take one tablet the night before procedure and one tablet the morning of procedure. 2 tablet 0  . predniSONE (DELTASONE) 10 MG tablet Take 10 mg by mouth daily.     . ranitidine (ZANTAC) 150 MG capsule Take 150 mg by mouth 3 (three) times daily.    Marland Kitchen REVLIMID 10 MG capsule     . SUMAtriptan (IMITREX) 25 MG tablet Take 1 tablet (25 mg total) by mouth every 2 (two) hours as needed for migraine. May repeat in 2 hours if headache persists or recurs. 10 tablet 10   Current Facility-Administered Medications  Medication Dose Route Frequency Provider Last Rate  Last Dose  . medroxyPROGESTERone (DEPO-PROVERA) injection 150 mg  150 mg Intramuscular Once [employer], PA-C         ALLERGIES: Review of patient's allergies indicates no known allergies.  Family History  Problem Relation Age of Onset  . Lupus Father     History   Social History  . Marital Status: Single    Spouse Name: N/A  . Number of Children: N/A  . Years of Education: N/A   Occupational History  . Not on file.   Social History Main Topics  . Smoking status: Never Smoker   . Smokeless tobacco: Never Used  . Alcohol Use: No  . Drug Use: No  . Sexual Activity:    Partners: Male    Birth Control/ Protection: Injection   Other Topics Concern  . Not on file   Social History Narrative    ROS:  Pertinent items are noted in HPI.  PHYSICAL EXAMINATION:    BP 110/70 mmHg  Pulse 84  Ht 5\' 1"  (1.549 m)  Wt 129 lb 12.8 oz (58.877 kg)  BMI 24.54 kg/m2     General appearance: alert, cooperative and appears stated age  Pelvic: External genitalia:  no lesions  Urethra:  normal appearing urethra with no masses, tenderness or lesions              Bartholins and Skenes: normal                 Vagina: normal appearing vagina with normal color and discharge, no lesions              Cervix: normal appearance                   Bimanual Exam:  Uterus:  uterus is normal size, shape, consistency and nontender                                      Adnexa: normal adnexa in size, nontender and no masses       Skyla IUD insertion. Lot number [id number], expiration 02/17. Consent for procedure.  Speculum placed in the vagina.  Sterile prep with Hibiclens.  Paracervical block with 10 cc 15 lidocaine - lot number [id number], expiration - 08/13/14.  Uterus sounded to 7 cm.  Skyla IUD placed without difficulty.   Strings trimmed.  No complications. Minimal EBL.                                 ASSESSMENT  Skyla IUD placed.   PLAN  Instructions and precautions  given.  Tylenol and heating pad prn cramping. IUD questions invited and answered prior to placement of IUD.  Follow up in 4 weeks, sooner as needed.   An After Visit Summary was printed and given to the patient.  __15____ minutes face to face time of which over 50% was spent in counseling.    Addendum  Patient with pain after IUD insertion.  Took Tylenol prior to insertion.  Bedside ultrasound done - IUD in canal.  Cannot see the top of the IUD well.   Will send to [facility] for transvaginal and transabdominal ultrasound for position check.

## 2014-06-16 ENCOUNTER — Telehealth: Payer: Self-pay | Admitting: Emergency Medicine

## 2014-06-16 NOTE — Telephone Encounter (Signed)
Message left to return call to Scherrie Seneca at [phone].    

## 2014-06-16 NOTE — Telephone Encounter (Signed)
Spoke with patient and message from Dr. Quincy Simmonds given. She is having intermittent cramping but using Vicodin and that really helps to alleviate symptoms. She will call back if any concerns.  Patient agreeable. Scheduled follow up in 4 weeks.

## 2014-06-16 NOTE — Telephone Encounter (Signed)
-----   Message from Nunzio Cobbs, MD sent at 06/13/2014  1:32 PM EDT ----- I have discussed results with patient by phone prior to her leaving radiology.  IUD in normal position.  Patient will take Vicodin during the weekend as needed. She will call if she develops any increased pain, heavy bleeding, or fevers.  She would like to continue with the IUD at this time.   Please set up an appointment for a 4 week recheck with me.  Thank you!

## 2014-07-04 ENCOUNTER — Ambulatory Visit: Payer: BC Managed Care – PPO | Admitting: Obstetrics and Gynecology

## 2014-07-07 ENCOUNTER — Encounter: Payer: Self-pay | Admitting: Obstetrics and Gynecology

## 2014-07-07 ENCOUNTER — Ambulatory Visit (INDEPENDENT_AMBULATORY_CARE_PROVIDER_SITE_OTHER): Payer: BC Managed Care – PPO | Admitting: Obstetrics and Gynecology

## 2014-07-07 VITALS — BP 110/78 | HR 66 | Ht 61.0 in | Wt 128.8 lb

## 2014-07-07 DIAGNOSIS — R102 Pelvic and perineal pain: Secondary | ICD-10-CM

## 2014-07-07 DIAGNOSIS — N949 Unspecified condition associated with female genital organs and menstrual cycle: Secondary | ICD-10-CM | POA: Diagnosis not present

## 2014-07-07 DIAGNOSIS — Z30431 Encounter for routine checking of intrauterine contraceptive device: Secondary | ICD-10-CM

## 2014-07-07 MED ORDER — IBUPROFEN 800 MG PO TABS: 800.0000 mg | ORAL_TABLET | Freq: Three times a day (TID) | ORAL | Status: DC | PRN

## 2014-07-07 NOTE — Progress Notes (Signed)
Patient ID: Samantha Davies, female   DOB: [birth year], 19 y.o.   MRN: [id number]   GYNECOLOGY  VISIT   HPI: 19 y.o.   Single  African American  female   G0P0 with Patient's last menstrual period was 07/06/2012.   here for [facility] recheck.  Status post placement of Skyla on 06/13/14.  Had significant cramping post procedure, and had pelvic ultrasound at [facility] immediately after placement which confirmed the IUD to be in proper location.  Was prescribed Vicodin for cramping.   Has severe lower abdominal midline cramping once or twice a week.  Lasts until takes Vicodin.  No bleeding.  Did not have cramping like this before the IUD placement.  Tylenol makes her sleepy and does not work as fast.  Has tired ibuprofen 2 tabs at a time.   No female or female dyspareunia.   Nl bleeding at all since the IUD placed.   GYNECOLOGIC HISTORY: Patient's last menstrual period was 07/06/2012. Contraception:    Menopausal hormone therapy:         OB History    Gravida Para Term Preterm AB TAB SAB Ectopic Multiple Living   0                  Patient Active Problem List   Diagnosis Date Noted  . History of urinary tract infection 05/15/2012  . COMMON MIGRAINE 09/10/2006  . Systemic lupus erythematosus 09/10/2006    Past Medical History  Diagnosis Date  . Lupus 2005    History reviewed. No pertinent past surgical history.  Current Outpatient Prescriptions  Medication Sig Dispense Refill  . cholecalciferol (VITAMIN D) 1000 UNITS tablet Take 1,000 Units by mouth 3 (three) times daily.    Marland Kitchen FLUOCINOLONE ACETONIDE BODY 0.01 % OIL Apply topically as needed.     . Hydrocodone-Acetaminophen (VICODIN) 5-300 MG TABS Take 1 tablet by mouth every 4 (four) hours as needed. 15 each 0  . hydroxychloroquine (PLAQUENIL) 200 MG tablet     . Levonorgestrel 13.5 MG IUD by Intrauterine route.    . misoprostol (CYTOTEC) 200 MCG tablet Take one tablet the night before procedure and one tablet the  morning of procedure. 2 tablet 0  . predniSONE (DELTASONE) 10 MG tablet Take 7.5 mg by mouth daily.     . ranitidine (ZANTAC) 150 MG capsule Take 150 mg by mouth 3 (three) times daily.    Marland Kitchen REVLIMID 10 MG capsule     . SUMAtriptan (IMITREX) 25 MG tablet Take 1 tablet (25 mg total) by mouth every 2 (two) hours as needed for migraine. May repeat in 2 hours if headache persists or recurs. 10 tablet 10   Current Facility-Administered Medications  Medication Dose Route Frequency Provider Last Rate Last Dose  . medroxyPROGESTERone (DEPO-PROVERA) injection 150 mg  150 mg Intramuscular Once [employer], PA-C         ALLERGIES: Review of patient's allergies indicates no known allergies.  Family History  Problem Relation Age of Onset  . Lupus Father     History   Social History  . Marital Status: Single    Spouse Name: N/A  . Number of Children: N/A  . Years of Education: N/A   Occupational History  . Not on file.   Social History Main Topics  . Smoking status: Never Smoker   . Smokeless tobacco: Never Used  . Alcohol Use: No  . Drug Use: No  . Sexual Activity:    Partners: Male  Birth Control/ Protection: Injection   Other Topics Concern  . Not on file   Social History Narrative    ROS:  Pertinent items are noted in HPI.  PHYSICAL EXAMINATION:    BP 110/78 mmHg  Pulse 66  Ht 5\' 1"  (1.549 m)  Wt 128 lb 12.8 oz (58.423 kg)  BMI 24.35 kg/m2  LMP 07/06/2012     General appearance: alert, cooperative and appears stated age   Pelvic: External genitalia:  no lesions              Urethra:  normal appearing urethra with no masses, tenderness or lesions              Bartholins and Skenes: normal                 Vagina: normal appearing vagina with normal color and discharge, no lesions              Cervix: normal appearance.  IUD strings noted.  No IUD extrusion.                    Bimanual Exam:  Uterus:  uterus is normal size, shape, consistency and nontender                                       Adnexa: normal adnexa in size, nontender and no masses                                         ASSESSMENT  Skyla IUD check up.  Pelvic cramping.   PLAN  Reassurance regarding IUD positioning.  Switch to Motrin 800 mg po q 8 hours prn.  Will try to stay away from regular use of narcotics. Call if cramping increases.  Return for annual exam.   An After Visit Summary was printed and given to the patient.  ___15______ minutes face to face time of which over 50% was spent in counseling.

## 2014-10-23 ENCOUNTER — Other Ambulatory Visit: Payer: Self-pay | Admitting: Family Medicine

## 2014-10-25 MED ORDER — SUMATRIPTAN SUCCINATE 25 MG PO TABS: ORAL_TABLET | ORAL | Status: DC

## 2014-10-25 NOTE — Addendum Note (Signed)
Addended by: Kem Boroughs D on: 10/25/2014 10:25 AM   Modules accepted: Orders

## 2014-12-18 ENCOUNTER — Telehealth: Payer: Self-pay | Admitting: Obstetrics and Gynecology

## 2014-12-18 NOTE — Telephone Encounter (Signed)
Left message to call Kaitlyn at [phone]. 

## 2014-12-18 NOTE — Telephone Encounter (Signed)
Spoke with patient. Patient has a Skyla IUD that was inserted on 06/13/2014. States that she is having bleeding that is in between spotting and a normal period which started 2 weeks ago. Bleeding is intermittent. Today she began to have increased cramping in her lower abdomin. Cramping is intermittent and is a 2/10. "It is more intense than my normal period cramping." Denies any nausea, chills, fatigue, weakness, dizziness, or shortness of breath. Advised patient irregular bleeding with IUD is not uncommon during the first 3-6 months as the body adjusts. Offered to schedule an office visit for further evaluation today or tomorrow. Patient declines due to work schedule. Requesting an appointment for 10/14. Appointment scheduled for 10/14 at 2:30 pm with Dr.Silva. Agreeable to date and time. Aware see will need to be seen at the ER if symptoms worsen over the weekend. If symptoms worsen over night will need to be seen in ER or at our office tomorrow for evaluation. Patient is agreeable.  Routing to provider for final review. Patient agreeable to disposition. Will close encounter.

## 2014-12-18 NOTE — Telephone Encounter (Signed)
Patient says she is having some bleeding and abdominal pain. No chart.

## 2014-12-26 ENCOUNTER — Ambulatory Visit (INDEPENDENT_AMBULATORY_CARE_PROVIDER_SITE_OTHER): Payer: BC Managed Care – PPO | Admitting: Obstetrics and Gynecology

## 2014-12-26 ENCOUNTER — Encounter: Payer: Self-pay | Admitting: Obstetrics and Gynecology

## 2014-12-26 VITALS — BP 96/68 | HR 62 | Resp 12 | Ht 61.0 in | Wt 129.0 lb

## 2014-12-26 DIAGNOSIS — N898 Other specified noninflammatory disorders of vagina: Secondary | ICD-10-CM | POA: Diagnosis not present

## 2014-12-26 DIAGNOSIS — N926 Irregular menstruation, unspecified: Secondary | ICD-10-CM

## 2014-12-26 LAB — POCT URINE PREGNANCY: Preg Test, Ur: NEGATIVE

## 2014-12-26 NOTE — Progress Notes (Signed)
GYNECOLOGY  VISIT   HPI: 19 y.o.   Single  African American  female   G0P0 with Patient's last menstrual period was 12/01/2014 (approximate).   here for irregular bleeding with IUD. Skyla place on 06/13/14. Did not have bleeding after placement until one month ago.  Worried about her IUD. Three weeks ago began bleeding and having some sharp pains.  Now is wearing a pad all the time.  Last week had some brown discharge. Some odor.  No burning or itching.  No OTC treatment.  Same partner.  No pain with intercourse.  Uses condoms. Has some sensitivity with this. Thinks it is all of the added flavor, odor, etc.  On fall break - sociology and psychology.   Urine UPT:  Negative  GYNECOLOGIC HISTORY: Patient's last menstrual period was 12/01/2014 (approximate). Contraception: IUD  Menopausal hormone therapy: None Last mammogram: None Last pap smear: None        OB History    Gravida Para Term Preterm AB TAB SAB Ectopic Multiple Living   0                  Patient Active Problem List   Diagnosis Date Noted  . History of urinary tract infection 05/15/2012  . COMMON MIGRAINE 09/10/2006  . Systemic lupus erythematosus ([place]) 09/10/2006    Past Medical History  Diagnosis Date  . Lupus 2005    No past surgical history on file.  Current Outpatient Prescriptions  Medication Sig Dispense Refill  . Hydrocodone-Acetaminophen (VICODIN) 5-300 MG TABS Take 1 tablet by mouth every 4 (four) hours as needed. 15 each 0  . hydroxychloroquine (PLAQUENIL) 200 MG tablet     . ibuprofen (ADVIL,MOTRIN) 800 MG tablet Take 1 tablet (800 mg total) by mouth every 8 (eight) hours as needed. 30 tablet 3  . Levonorgestrel 13.5 MG IUD by Intrauterine route.    . predniSONE (DELTASONE) 10 MG tablet Take 7.5 mg by mouth daily.     Marland Kitchen REVLIMID 10 MG capsule     . SUMAtriptan (IMITREX) 25 MG tablet Take 1 tab by mouth as needed. May repeat in 2 hrs if migraine persists. PATIENT NEEDS OFFICE VISIT FOR  ADDITIONAL REFILLS 10 tablet 0   No current facility-administered medications for this visit.     ALLERGIES: Review of patient's allergies indicates no known allergies.  Family History  Problem Relation Age of Onset  . Lupus Father     Social History   Social History  . Marital Status: Single    Spouse Name: N/A  . Number of Children: N/A  . Years of Education: N/A   Occupational History  . Not on file.   Social History Main Topics  . Smoking status: Never Smoker   . Smokeless tobacco: Never Used  . Alcohol Use: No  . Drug Use: No  . Sexual Activity:    Partners: Male    Birth Control/ Protection: Injection   Other Topics Concern  . Not on file   Social History Narrative    ROS:  Pertinent items are noted in HPI.  PHYSICAL EXAMINATION:    BP 96/68 mmHg  Pulse 62  Resp 12  Ht 5\' 1"  (1.549 m)  Wt 129 lb (58.514 kg)  BMI 24.39 kg/m2  LMP 12/01/2014 (Approximate)    General appearance: alert, cooperative and appears stated age   Pelvic: External genitalia:  no lesions              Urethra:  normal appearing  urethra with no masses, tenderness or lesions              Bartholins and Skenes: normal                 Vagina: normal appearing vagina with normal color and discharge, no lesions.  No blood noted.               Cervix: no lesions and IUD strings seen.          Bimanual Exam:  Uterus:  normal size, contour, position, consistency, mobility, non-tender              Adnexa: normal adnexa and no mass, fullness, tenderness           Chaperone was present for exam.  ASSESSMENT  Skyla IUD patient.  Irregular menses.  Vaginal discharge. Condom sensitivity.   PLAN  Counseled regarding Skyla IUD bleeding profiles. Reassurance given.  Affirm testing.  Try condoms without spermicide.  Try to use pads only if needed in order to avoid dermatitis to skin form chronic use.   Follow up prn.    An After Visit Summary was printed and given to the  patient.  ______ minutes face to face time of which over 50% was spent in counseling.

## 2014-12-27 LAB — WET PREP BY MOLECULAR PROBE
Candida species: NEGATIVE
Gardnerella vaginalis: POSITIVE — AB
Trichomonas vaginosis: NEGATIVE

## 2014-12-29 ENCOUNTER — Telehealth: Payer: Self-pay

## 2014-12-29 MED ORDER — METRONIDAZOLE 0.75 % VA GEL: 1.0000 | Freq: Two times a day (BID) | VAGINAL | Status: DC

## 2014-12-29 NOTE — Telephone Encounter (Signed)
-----   Message from Nunzio Cobbs, MD sent at 12/27/2014  1:31 PM EDT ----- Please report bacterial vaginosis to patient.  She prefers Metrogel. Please send in to pharmacy of choice:  Metrogel pv at hs for 5 nights.   Abstain from intercourse until treatment is completed.  Cc- Marisa Sprinkles

## 2014-12-29 NOTE — Telephone Encounter (Signed)
Spoke with patient. Advised of message and results as seen below from [place]. Patient is agreeable and verbalizes understanding. Rx for Metrogel 0.75% pv at night for 5 nights sent to pharmacy on file. Advised to abstain from intercourse until treatment is completed. ETOH precautions given.  Routing to provider for final review. Patient agreeable to disposition. Will close encounter.

## 2015-03-27 DIAGNOSIS — L910 Hypertrophic scar: Secondary | ICD-10-CM | POA: Insufficient documentation

## 2015-04-03 ENCOUNTER — Ambulatory Visit: Payer: BC Managed Care – PPO | Admitting: Nurse Practitioner

## 2015-04-10 ENCOUNTER — Encounter: Payer: Self-pay | Admitting: Nurse Practitioner

## 2015-04-10 ENCOUNTER — Ambulatory Visit (INDEPENDENT_AMBULATORY_CARE_PROVIDER_SITE_OTHER): Payer: BC Managed Care – PPO | Admitting: Nurse Practitioner

## 2015-04-10 VITALS — BP 116/74 | HR 72 | Ht 61.0 in | Wt 130.0 lb

## 2015-04-10 DIAGNOSIS — Z0000 Encounter for general adult medical examination without abnormal findings: Secondary | ICD-10-CM | POA: Diagnosis not present

## 2015-04-10 DIAGNOSIS — Z01419 Encounter for gynecological examination (general) (routine) without abnormal findings: Secondary | ICD-10-CM | POA: Diagnosis not present

## 2015-04-10 LAB — POCT URINALYSIS DIPSTICK
Bilirubin, UA: NEGATIVE
Glucose, UA: NEGATIVE
Ketones, UA: NEGATIVE
LEUKOCYTES UA: NEGATIVE
NITRITE UA: NEGATIVE
PH UA: 6
PROTEIN UA: NEGATIVE
RBC UA: NEGATIVE
UROBILINOGEN UA: NEGATIVE

## 2015-04-10 NOTE — Patient Instructions (Signed)
General topics  Next pap or exam is  due in 1 year Take a Women's multivitamin Take 1200 mg. of calcium daily - prefer dietary If any concerns in interim to call back  Breast Self-Awareness Practicing breast self-awareness may pick up problems early, prevent significant medical complications, and possibly save your life. By practicing breast self-awareness, you can become familiar with how your breasts look and feel and if your breasts are changing. This allows you to notice changes early. It can also offer you some reassurance that your breast health is good. One way to learn what is normal for your breasts and whether your breasts are changing is to do a breast self-exam. If you find a lump or something that was not present in the past, it is best to contact your caregiver right away. Other findings that should be evaluated by your caregiver include nipple discharge, especially if it is bloody; skin changes or reddening; areas where the skin seems to be pulled in (retracted); or new lumps and bumps. Breast pain is seldom associated with cancer (malignancy), but should also be evaluated by a caregiver. BREAST SELF-EXAM The best time to examine your breasts is 5 7 days after your menstrual period is over.  ExitCare Patient Information 2013 [employer].   Exercise to Stay Healthy Exercise helps you become and stay healthy. EXERCISE IDEAS AND TIPS Choose exercises that:  You enjoy.  Fit into your day. You do not need to exercise really hard to be healthy. You can do exercises at a slow or medium level and stay healthy. You can:  Stretch before and after working out.  Try yoga, Pilates, or tai chi.  Lift weights.  Walk fast, swim, jog, run, climb stairs, bicycle, dance, or rollerskate.  Take aerobic classes. Exercises that burn about 150 calories:  Running 1  miles in 15 minutes.  Playing volleyball for 45 to 60 minutes.  Washing and waxing a car for 45 to 60  minutes.  Playing touch football for 45 minutes.  Walking 1  miles in 35 minutes.  Pushing a stroller 1  miles in 30 minutes.  Playing basketball for 30 minutes.  Raking leaves for 30 minutes.  Bicycling 5 miles in 30 minutes.  Walking 2 miles in 30 minutes.  Dancing for 30 minutes.  Shoveling snow for 15 minutes.  Swimming laps for 20 minutes.  Walking up stairs for 15 minutes.  Bicycling 4 miles in 15 minutes.  Gardening for 30 to 45 minutes.  Jumping rope for 15 minutes.  Washing windows or floors for 45 to 60 minutes. Document Released: 04/02/2010 Document Revised: 05/23/2011 Document Reviewed: 04/02/2010 ExitCare Patient Information 2013 [employer].   Other topics ( that may be useful information):    Sexually Transmitted Disease Sexually transmitted disease (STD) refers to any infection that is passed from person to person during sexual activity. This may happen by way of saliva, semen, blood, vaginal mucus, or urine. Common STDs include:  Gonorrhea.  Chlamydia.  Syphilis.  HIV/AIDS.  Genital herpes.  Hepatitis B and C.  Trichomonas.  Human papillomavirus (HPV).  Pubic lice. CAUSES  An STD may be spread by bacteria, virus, or parasite. A person can get an STD by:  Sexual intercourse with an infected person.  Sharing sex toys with an infected person.  Sharing needles with an infected person.  Having intimate contact with the genitals, mouth, or rectal areas of an infected person. SYMPTOMS  Some people may not have any symptoms, but   they can still pass the infection to others. Different STDs have different symptoms. Symptoms include:  Painful or bloody urination.  Pain in the pelvis, abdomen, vagina, anus, throat, or eyes.  Skin rash, itching, irritation, growths, or sores (lesions). These usually occur in the genital or anal area.  Abnormal vaginal discharge.  Penile discharge in men.  Soft, flesh-colored skin growths in the  genital or anal area.  Fever.  Pain or bleeding during sexual intercourse.  Swollen glands in the groin area.  Yellow skin and eyes (jaundice). This is seen with hepatitis. DIAGNOSIS  To make a diagnosis, your caregiver may:  Take a medical history.  Perform a physical exam.  Take a specimen (culture) to be examined.  Examine a sample of discharge under a microscope.  Perform blood test TREATMENT   Chlamydia, gonorrhea, trichomonas, and syphilis can be cured with antibiotic medicine.  Genital herpes, hepatitis, and HIV can be treated, but not cured, with prescribed medicines. The medicines will lessen the symptoms.  Genital warts from HPV can be treated with medicine or by freezing, burning (electrocautery), or surgery. Warts may come back.  HPV is a virus and cannot be cured with medicine or surgery.However, abnormal areas may be followed very closely by your caregiver and may be removed from the cervix, vagina, or vulva through office procedures or surgery. If your diagnosis is confirmed, your recent sexual partners need treatment. This is true even if they are symptom-free or have a negative culture or evaluation. They should not have sex until their caregiver says it is okay. HOME CARE INSTRUCTIONS  All sexual partners should be informed, tested, and treated for all STDs.  Take your antibiotics as directed. Finish them even if you start to feel better.  Only take over-the-counter or prescription medicines for pain, discomfort, or fever as directed by your caregiver.  Rest.  Eat a balanced diet and drink enough fluids to keep your urine clear or pale yellow.  Do not have sex until treatment is completed and you have followed up with your caregiver. STDs should be checked after treatment.  Keep all follow-up appointments, Pap tests, and blood tests as directed by your caregiver.  Only use latex condoms and water-soluble lubricants during sexual activity. Do not use  petroleum jelly or oils.  Avoid alcohol and illegal drugs.  Get vaccinated for HPV and hepatitis. If you have not received these vaccines in the past, talk to your caregiver about whether one or both might be right for you.  Avoid risky sex practices that can break the skin. The only way to avoid getting an STD is to avoid all sexual activity.Latex condoms and dental dams (for oral sex) will help lessen the risk of getting an STD, but will not completely eliminate the risk. SEEK MEDICAL CARE IF:   You have a fever.  You have any new or worsening symptoms. Document Released: 05/21/2002 Document Revised: 05/23/2011 Document Reviewed: 05/28/2010 [facility] Patient Information 2013 [employer].    Domestic Abuse You are being battered or abused if someone close to you hits, pushes, or physically hurts you in any way. You also are being abused if you are forced into activities. You are being sexually abused if you are forced to have sexual contact of any kind. You are being emotionally abused if you are made to feel worthless or if you are constantly threatened. It is important to remember that help is available. No one has the right to abuse you. PREVENTION OF FURTHER  ABUSE  Learn the warning signs of danger. This varies with situations but may include: the use of alcohol, threats, isolation from friends and family, or forced sexual contact. Leave if you feel that violence is going to occur.  If you are attacked or beaten, report it to the police so the abuse is documented. You do not have to press charges. The police can protect you while you or the attackers are leaving. Get the officer's name and badge number and a copy of the report.  Find someone you can trust and tell them what is happening to you: your caregiver, a nurse, clergy member, close friend or family member. Feeling ashamed is natural, but remember that you have done nothing wrong. No one deserves abuse. Document Released:  02/26/2000 Document Revised: 05/23/2011 Document Reviewed: 05/06/2010 ExitCare Patient Information 2013 [employer].    How Much is Too Much Alcohol? Drinking too much alcohol can cause injury, accidents, and health problems. These types of problems can include:   Car crashes.  Falls.  Family fighting (domestic violence).  Drowning.  Fights.  Injuries.  Burns.  Damage to certain organs.  Having a baby with birth defects. ONE DRINK CAN BE TOO MUCH WHEN YOU ARE:  Working.  Pregnant or breastfeeding.  Taking medicines. Ask your doctor.  Driving or planning to drive. If you or someone you know has a drinking problem, get help from a doctor.  Document Released: 12/25/2008 Document Revised: 05/23/2011 Document Reviewed: 12/25/2008 ExitCare Patient Information 2013 [employer].   Smoking Hazards Smoking cigarettes is extremely bad for your health. Tobacco smoke has over 200 known poisons in it. There are over 60 chemicals in tobacco smoke that cause cancer. Some of the chemicals found in cigarette smoke include:   Cyanide.  Benzene.  Formaldehyde.  Methanol (wood alcohol).  Acetylene (fuel used in welding torches).  Ammonia. Cigarette smoke also contains the poisonous gases nitrogen oxide and carbon monoxide.  Cigarette smokers have an increased risk of many serious medical problems and Smoking causes approximately:  90% of all lung cancer deaths in men.  80% of all lung cancer deaths in women.  90% of deaths from chronic obstructive lung disease. Compared with nonsmokers, smoking increases the risk of:  Coronary heart disease by 2 to 4 times.  Stroke by 2 to 4 times.  Men developing lung cancer by 23 times.  Women developing lung cancer by 13 times.  Dying from chronic obstructive lung diseases by 12 times.  . Smoking is the most preventable cause of death and disease in our society.  WHY IS SMOKING ADDICTIVE?  Nicotine is the chemical  agent in tobacco that is capable of causing addiction or dependence.  When you smoke and inhale, nicotine is absorbed rapidly into the bloodstream through your lungs. Nicotine absorbed through the lungs is capable of creating a powerful addiction. Both inhaled and non-inhaled nicotine may be addictive.  Addiction studies of cigarettes and spit tobacco show that addiction to nicotine occurs mainly during the teen years, when young people begin using tobacco products. WHAT ARE THE BENEFITS OF QUITTING?  There are many health benefits to quitting smoking.   Likelihood of developing cancer and heart disease decreases. Health improvements are seen almost immediately.  Blood pressure, pulse rate, and breathing patterns start returning to normal soon after quitting. QUITTING SMOKING   American Lung Association - 1-800-LUNGUSA  American Cancer Society - 1-800-ACS-2345 Document Released: 04/07/2004 Document Revised: 05/23/2011 Document Reviewed: 12/10/2008 ExitCare Patient Information 2013 ExitCare,   LLC.   Stress Management Stress is a state of physical or mental tension that often results from changes in your life or normal routine. Some common causes of stress are:  Death of a loved one.  Injuries or severe illnesses.  Getting fired or changing jobs.  Moving into a new home. Other causes may be:  Sexual problems.  Business or financial losses.  Taking on a large debt.  Regular conflict with someone at home or at work.  Constant tiredness from lack of sleep. It is not just bad things that are stressful. It may be stressful to:  Win the lottery.  Get married.  Buy a new car. The amount of stress that can be easily tolerated varies from person to person. Changes generally cause stress, regardless of the types of change. Too much stress can affect your health. It may lead to physical or emotional problems. Too little stress (boredom) may also become stressful. SUGGESTIONS TO  REDUCE STRESS:  Talk things over with your family and friends. It often is helpful to share your concerns and worries. If you feel your problem is serious, you may want to get help from a professional counselor.  Consider your problems one at a time instead of lumping them all together. Trying to take care of everything at once may seem impossible. List all the things you need to do and then start with the most important one. Set a goal to accomplish 2 or 3 things each day. If you expect to do too many in a single day you will naturally fail, causing you to feel even more stressed.  Do not use alcohol or drugs to relieve stress. Although you may feel better for a short time, they do not remove the problems that caused the stress. They can also be habit forming.  Exercise regularly - at least 3 times per week. Physical exercise can help to relieve that "uptight" feeling and will relax you.  The shortest distance between despair and hope is often a good night's sleep.  Go to bed and get up on time allowing yourself time for appointments without being rushed.  Take a short "time-out" period from any stressful situation that occurs during the day. Close your eyes and take some deep breaths. Starting with the muscles in your face, tense them, hold it for a few seconds, then relax. Repeat this with the muscles in your neck, shoulders, hand, stomach, back and legs.  Take good care of yourself. Eat a balanced diet and get plenty of rest.  Schedule time for having fun. Take a break from your daily routine to relax. HOME CARE INSTRUCTIONS   Call if you feel overwhelmed by your problems and feel you can no longer manage them on your own.  Return immediately if you feel like hurting yourself or someone else. Document Released: 08/24/2000 Document Revised: 05/23/2011 Document Reviewed: 04/16/2007 ExitCare Patient Information 2013 ExitCare, [employer].  

## 2015-04-10 NOTE — Progress Notes (Signed)
Patient ID: Samantha Davies, female   DOB: [birth year], 20 y.o.   MRN: [id number]  19 y.o. G0P0 Single  African American Fe here for annual exam.  Same partner for 3 yrs.  No menses since on Depo.  Really likes Samantha Davies IUD.  No concerns about STD's.  She remains on Plaquenil and steroids for Lupus.  She remains in school for psychology - may want to pursue animal therapy.   (she has a 'ghost' named Samantha Davies - he came with the car)  Patient's last menstrual period was 03/14/2013.  On Depo before insertion of IUD       Sexually active: Yes.  Same partner times 3 years. The current method of family planning is condoms most of the time and IUD. Skyla inserted 06/13/14.    Exercising: No.  The patient does not participate in regular exercise at present. Smoker:  no  Health Maintenance: Pap:  Never TDaP:  UTD ? 2013 HIV: 03/26/14 Labs: HB: 13.1  Urine:  negative   reports that she has never smoked. She has never used smokeless tobacco. She reports that she drinks about 1.2 - 3.6 oz of alcohol per week. She reports that she does not use illicit drugs.  Past Medical History  Diagnosis Date  . Lupus ([place]) 2005    History reviewed. No pertinent past surgical history.  Current Outpatient Prescriptions  Medication Sig Dispense Refill  . belimumab (BENLYSTA) 120 MG SOLR injection Inject 10 mg/kg into the vein every 30 (thirty) days.    . DULoxetine (CYMBALTA) 30 MG capsule Take 1 capsule by mouth daily.  2  . Hydrocodone-Acetaminophen (VICODIN) 5-300 MG TABS Take 1 tablet by mouth every 4 (four) hours as needed. 15 each 0  . hydroxychloroquine (PLAQUENIL) 200 MG tablet     . hydrOXYzine (ATARAX/VISTARIL) 10 MG tablet Take 1 tablet by mouth daily.  3  . Levonorgestrel 13.5 MG IUD by Intrauterine route.    . predniSONE (DELTASONE) 10 MG tablet Take 7.5 mg by mouth daily.     Marland Kitchen REVLIMID 10 MG capsule     . SUMAtriptan (IMITREX) 25 MG tablet Take 1 tab by mouth as needed. May repeat in 2 hrs if migraine  persists. PATIENT NEEDS OFFICE VISIT FOR ADDITIONAL REFILLS 10 tablet 0   No current facility-administered medications for this visit.    Family History  Problem Relation Age of Onset  . Lupus Father   . Kidney failure Father     associated with Lupus  . Aneurysm Paternal Grandmother   . Alcohol abuse Paternal Grandmother   . Breast cancer Maternal Grandmother   . Cancer Paternal Grandfather     unsure type    ROS:  Pertinent items are noted in HPI.  Otherwise, a comprehensive ROS was negative.  Exam:   BP 116/74 mmHg  Pulse 72  Ht 5\' 1"  (1.549 m)  Wt 130 lb (58.968 kg)  BMI 24.58 kg/m2  LMP 03/14/2013 Height: 5\' 1"  (154.9 cm) Ht Readings from Last 3 Encounters:  04/10/15 5\' 1"  (1.549 m)  12/26/14 5\' 1"  (1.549 m)  07/07/14 5\' 1"  (1.549 m)    General appearance: alert, cooperative and appears stated age Head: Normocephalic, without obvious abnormality, atraumatic Neck: no adenopathy, supple, symmetrical, trachea midline and thyroid normal to inspection and palpation Lungs: clear to auscultation bilaterally Breasts: normal appearance, no masses or tenderness Heart: regular rate and rhythm Abdomen: soft, non-tender; no masses,  no organomegaly Extremities: extremities normal, atraumatic, no cyanosis or edema Skin: Skin  color, texture, turgor normal. No rashes or lesions Lymph nodes: Cervical, supraclavicular, and axillary nodes normal. No abnormal inguinal nodes palpated Neurologic: Grossly normal   Pelvic: External genitalia:  no lesions              Urethra:  normal appearing urethra with no masses, tenderness or lesions              Bartholin's and Skene's: normal                 Vagina: normal appearing vagina with normal color and discharge, no lesions              Cervix: anteverted - IUD strings are visible              Pap taken: Yes.   Bimanual Exam:  Uterus:  normal size, contour, position, consistency, mobility, non-tender              Adnexa: no mass,  fullness, tenderness               Rectovaginal: Confirms               Anus:  normal sphincter tone, no lesions  Chaperone present: yes  A:  Well Woman with normal exam  Skyla IUD 4/1/22016 History of Lupus with multiple skin and nail lesions  P:   Reviewed health and wellness pertinent to exam  Pap smear as above  Encouraged calcium and Vit D  Counseled on breast self exam, STD prevention, HIV risk factors and prevention, adequate intake of calcium and vitamin D, diet and exercise return annually or prn  An After Visit Summary was printed and given to the patient.

## 2015-04-12 NOTE — Progress Notes (Signed)
Encounter reviewed by Dr. Brook Amundson C. Silva.  

## 2015-04-15 LAB — IPS PAP TEST WITH REFLEX TO HPV

## 2015-04-16 LAB — HEMOGLOBIN, FINGERSTICK: Hemoglobin, fingerstick: 13.1 g/dL (ref 12.0–16.0)

## 2015-04-22 ENCOUNTER — Telehealth: Payer: Self-pay | Admitting: Emergency Medicine

## 2015-04-22 NOTE — Telephone Encounter (Signed)
Message left to return call to Janna [place] at [phone].    

## 2015-04-22 NOTE — Telephone Encounter (Signed)
-----   Message from Samantha Davies, [place] sent at 04/20/2015  1:05 PM EST ----- Patient needs to be notified of abnormal pap (1st pap) with ASCUS and + HR HPV.  Due to her age no colpo biopsy is recommended but she needs pap in 1 year.  08 recall.  She should use condoms each time SA.

## 2015-05-05 NOTE — Telephone Encounter (Signed)
Message left to return call to [place] at [phone].   08 recall entered for one year.

## 2015-05-05 NOTE — Telephone Encounter (Signed)
Patient returned call and she is advised of results and message from [place], [place]. 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 Discussed HPV results, transmission, prevalence and bodies immune response. Advised of importance of follow up pap smear in one year to ensure no further cervical cell changes.  Patient verbalized understanding and will follow up as scheduled. Routing to provider for final review. Patient agreeable to disposition. Will close encounter.

## 2015-07-23 ENCOUNTER — Emergency Department (HOSPITAL_COMMUNITY)
Admission: EM | Admit: 2015-07-23 | Discharge: 2015-07-23 | Disposition: A | Discharge status: Home / Self Care | Payer: BC Managed Care – PPO | Attending: Emergency Medicine | Admitting: Emergency Medicine

## 2015-07-23 ENCOUNTER — Encounter (HOSPITAL_COMMUNITY): Payer: Self-pay

## 2015-07-23 DIAGNOSIS — Z7952 Long term (current) use of systemic steroids: Secondary | ICD-10-CM | POA: Diagnosis not present

## 2015-07-23 DIAGNOSIS — Z79891 Long term (current) use of opiate analgesic: Secondary | ICD-10-CM | POA: Insufficient documentation

## 2015-07-23 DIAGNOSIS — M329 Systemic lupus erythematosus, unspecified: Secondary | ICD-10-CM | POA: Diagnosis not present

## 2015-07-23 DIAGNOSIS — R112 Nausea with vomiting, unspecified: Secondary | ICD-10-CM | POA: Diagnosis present

## 2015-07-23 DIAGNOSIS — K2920 Alcoholic gastritis without bleeding: Secondary | ICD-10-CM | POA: Diagnosis not present

## 2015-07-23 DIAGNOSIS — Z79899 Other long term (current) drug therapy: Secondary | ICD-10-CM | POA: Diagnosis not present

## 2015-07-23 LAB — CBC
HCT: 39.2 % (ref 36.0–46.0)
Hemoglobin: 13.8 g/dL (ref 12.0–15.0)
MCH: 29.5 pg (ref 26.0–34.0)
MCHC: 35.2 g/dL (ref 30.0–36.0)
MCV: 83.8 fL (ref 78.0–100.0)
PLATELETS: 229 10*3/uL (ref 150–400)
RBC: 4.68 MIL/uL (ref 3.87–5.11)
RDW: 14.1 % (ref 11.5–15.5)
WBC: 8.3 10*3/uL (ref 4.0–10.5)

## 2015-07-23 LAB — URINALYSIS, ROUTINE W REFLEX MICROSCOPIC
Bilirubin Urine: NEGATIVE
Glucose, UA: NEGATIVE mg/dL
HGB URINE DIPSTICK: NEGATIVE
Ketones, ur: NEGATIVE mg/dL
Leukocytes, UA: NEGATIVE
NITRITE: NEGATIVE
PROTEIN: NEGATIVE mg/dL
Specific Gravity, Urine: 1.017 (ref 1.005–1.030)
pH: 8 (ref 5.0–8.0)

## 2015-07-23 LAB — COMPREHENSIVE METABOLIC PANEL
ALBUMIN: 5.1 g/dL — AB (ref 3.5–5.0)
ALK PHOS: 77 U/L (ref 38–126)
ALT: 21 U/L (ref 14–54)
ANION GAP: 12 (ref 5–15)
AST: 25 U/L (ref 15–41)
BILIRUBIN TOTAL: 1.3 mg/dL — AB (ref 0.3–1.2)
BUN: 8 mg/dL (ref 6–20)
CALCIUM: 9.5 mg/dL (ref 8.9–10.3)
CO2: 24 mmol/L (ref 22–32)
CREATININE: 0.7 mg/dL (ref 0.44–1.00)
Chloride: 106 mmol/L (ref 101–111)
GFR calc Af Amer: >60 mL/min (ref >60)
GFR calc non Af Amer: >60 mL/min (ref >60)
GLUCOSE: 86 mg/dL (ref 65–99)
Potassium: 3.8 mmol/L (ref 3.5–5.1)
Sodium: 142 mmol/L (ref 135–145)
TOTAL PROTEIN: 7.9 g/dL (ref 6.5–8.1)

## 2015-07-23 LAB — LIPASE, BLOOD: Lipase: 23 U/L (ref 11–51)

## 2015-07-23 MED ORDER — SODIUM CHLORIDE 0.9 % IV SOLN: INTRAVENOUS | Status: DC

## 2015-07-23 MED ORDER — DIPHENHYDRAMINE HCL 50 MG/ML IJ SOLN
12.5000 mg | Freq: Once | INTRAMUSCULAR | Status: AC
  Administered 2015-07-23: 12.5 mg via INTRAVENOUS
  Filled 2015-07-23: qty 1

## 2015-07-23 MED ORDER — ONDANSETRON 4 MG PO TBDP
4.0000 mg | ORAL_TABLET | Freq: Once | ORAL | Status: AC
  Administered 2015-07-23: 4 mg via ORAL
  Filled 2015-07-23: qty 1

## 2015-07-23 MED ORDER — SUCRALFATE 1 G PO TABS: 1.0000 g | ORAL_TABLET | Freq: Four times a day (QID) | ORAL | Status: DC

## 2015-07-23 MED ORDER — ONDANSETRON HCL 4 MG/2ML IJ SOLN
4.0000 mg | Freq: Once | INTRAMUSCULAR | Status: AC
  Administered 2015-07-23: 4 mg via INTRAVENOUS
  Filled 2015-07-23: qty 2

## 2015-07-23 MED ORDER — SODIUM CHLORIDE 0.9 % IV BOLUS (SEPSIS)
2000.0000 mL | Freq: Once | INTRAVENOUS | Status: AC
  Administered 2015-07-23: 2000 mL via INTRAVENOUS

## 2015-07-23 MED ORDER — METOCLOPRAMIDE HCL 5 MG/ML IJ SOLN
10.0000 mg | Freq: Once | INTRAMUSCULAR | Status: AC
  Administered 2015-07-23: 10 mg via INTRAVENOUS
  Filled 2015-07-23: qty 2

## 2015-07-23 MED ORDER — ONDANSETRON 8 MG PO TBDP: 8.0000 mg | ORAL_TABLET | Freq: Three times a day (TID) | ORAL | Status: DC | PRN

## 2015-07-23 MED ORDER — FAMOTIDINE IN NACL 20-0.9 MG/50ML-% IV SOLN
20.0000 mg | Freq: Once | INTRAVENOUS | Status: AC
Start: 2015-07-23 — End: 2015-07-23
  Administered 2015-07-23: 20 mg via INTRAVENOUS
  Filled 2015-07-23: qty 50

## 2015-07-23 NOTE — Discharge Instructions (Signed)
Gastritis, Adult Gastritis is soreness and swelling (inflammation) of the lining of the stomach. Gastritis can develop as a sudden onset (acute) or long-term (chronic) condition. If gastritis is not treated, it can lead to stomach bleeding and ulcers. CAUSES  Gastritis occurs when the stomach lining is weak or damaged. Digestive juices from the stomach then inflame the weakened stomach lining. The stomach lining may be weak or damaged due to viral or bacterial infections. One common bacterial infection is the Helicobacter pylori infection. Gastritis can also result from excessive alcohol consumption, taking certain medicines, or having too much acid in the stomach.  SYMPTOMS  In some cases, there are no symptoms. When symptoms are present, they may include:  Pain or a burning sensation in the upper abdomen.  Nausea.  Vomiting.  An uncomfortable feeling of fullness after eating. DIAGNOSIS  Your caregiver may suspect you have gastritis based on your symptoms and a physical exam. To determine the cause of your gastritis, your caregiver may perform the following:  Blood or stool tests to check for the H pylori bacterium.  Gastroscopy. A thin, flexible tube (endoscope) is passed down the esophagus and into the stomach. The endoscope has a light and camera on the end. Your caregiver uses the endoscope to view the inside of the stomach.  Taking a tissue sample (biopsy) from the stomach to examine under a microscope. TREATMENT  Depending on the cause of your gastritis, medicines may be prescribed. If you have a bacterial infection, such as an H pylori infection, antibiotics may be given. If your gastritis is caused by too much acid in the stomach, H2 blockers or antacids may be given. Your caregiver may recommend that you stop taking aspirin, ibuprofen, or other nonsteroidal anti-inflammatory drugs (NSAIDs). HOME CARE INSTRUCTIONS  Only take over-the-counter or prescription medicines as directed by  your caregiver.  If you were given antibiotic medicines, take them as directed. Finish them even if you start to feel better.  Drink enough fluids to keep your urine clear or pale yellow.  Avoid foods and drinks that make your symptoms worse, such as:  Caffeine or alcoholic drinks.  Chocolate.  Peppermint or mint flavorings.  Garlic and onions.  Spicy foods.  Citrus fruits, such as oranges, lemons, or limes.  Tomato-based foods such as sauce, chili, salsa, and pizza.  Fried and fatty foods.  Eat small, frequent meals instead of large meals. SEEK IMMEDIATE MEDICAL CARE IF:   You have black or dark red stools.  You vomit blood or material that looks like coffee grounds.  You are unable to keep fluids down.  Your abdominal pain gets worse.  You have a fever.  You do not feel better after 1 week.  You have any other questions or concerns. MAKE SURE YOU:  Understand these instructions.  Will watch your condition.  Will get help right away if you are not doing well or get worse.   This information is not intended to replace advice given to you by your health care provider. Make sure you discuss any questions you have with your health care provider.   Document Released: 02/22/2001 Document Revised: 08/30/2011 Document Reviewed: 04/13/2011 Elsevier Interactive Patient Education 2016 Elsevier Inc[employer]  Gastritis, Adult Gastritis is soreness and swelling (inflammation) of the lining of the stomach. Gastritis can develop as a sudden onset (acute) or long-term (chronic) condition. If gastritis is not treated, it can lead to stomach bleeding and ulcers. CAUSES  Gastritis occurs when the stomach lining is weak  or damaged. Digestive juices from the stomach then inflame the weakened stomach lining. The stomach lining may be weak or damaged due to viral or bacterial infections. One common bacterial infection is the Helicobacter pylori infection. Gastritis can also result from  excessive alcohol consumption, taking certain medicines, or having too much acid in the stomach.  SYMPTOMS  In some cases, there are no symptoms. When symptoms are present, they may include:  Pain or a burning sensation in the upper abdomen.  Nausea.  Vomiting.  An uncomfortable feeling of fullness after eating. DIAGNOSIS  Your caregiver may suspect you have gastritis based on your symptoms and a physical exam. To determine the cause of your gastritis, your caregiver may perform the following:  Blood or stool tests to check for the H pylori bacterium.  Gastroscopy. A thin, flexible tube (endoscope) is passed down the esophagus and into the stomach. The endoscope has a light and camera on the end. Your caregiver uses the endoscope to view the inside of the stomach.  Taking a tissue sample (biopsy) from the stomach to examine under a microscope. TREATMENT  Depending on the cause of your gastritis, medicines may be prescribed. If you have a bacterial infection, such as an H pylori infection, antibiotics may be given. If your gastritis is caused by too much acid in the stomach, H2 blockers or antacids may be given. Your caregiver may recommend that you stop taking aspirin, ibuprofen, or other nonsteroidal anti-inflammatory drugs (NSAIDs). HOME CARE INSTRUCTIONS  Only take over-the-counter or prescription medicines as directed by your caregiver.  If you were given antibiotic medicines, take them as directed. Finish them even if you start to feel better.  Drink enough fluids to keep your urine clear or pale yellow.  Avoid foods and drinks that make your symptoms worse, such as:  Caffeine or alcoholic drinks.  Chocolate.  Peppermint or mint flavorings.  Garlic and onions.  Spicy foods.  Citrus fruits, such as oranges, lemons, or limes.  Tomato-based foods such as sauce, chili, salsa, and pizza.  Fried and fatty foods.  Eat small, frequent meals instead of large meals. SEEK  IMMEDIATE MEDICAL CARE IF:   You have black or dark red stools.  You vomit blood or material that looks like coffee grounds.  You are unable to keep fluids down.  Your abdominal pain gets worse.  You have a fever.  You do not feel better after 1 week.  You have any other questions or concerns. MAKE SURE YOU:  Understand these instructions.  Will watch your condition.  Will get help right away if you are not doing well or get worse.   This information is not intended to replace advice given to you by your health care provider. Make sure you discuss any questions you have with your health care provider.   Document Released: 02/22/2001 Document Revised: 08/30/2011 Document Reviewed: 04/13/2011 Elsevier Interactive Patient Education [employer].

## 2015-07-23 NOTE — ED Provider Notes (Signed)
CSN: [id number]     Arrival date & time 07/23/15  1729 History   First MD Initiated Contact with Patient 07/23/15 1852     Chief Complaint  Patient presents with  . Nausea  . Emesis  . Lupus     (Consider location/radiation/quality/duration/timing/severity/associated sxs/prior Treatment) HPI Comments: Patient here complaining of diffuse abdominal discomfort along with nonbilious emesis 1 day. Patient states that she drank copious amount of alcohol last night and asked when his symptoms started. Denies any hematemesis. No fever or chills. No black or bloody stools. Has tried anti-emetics which did not help. Symptoms are worse when she tries to drink water  Patient is a 20 y.o. female presenting with vomiting. The history is provided by the patient and a relative.  Emesis   Past Medical History  Diagnosis Date  . Lupus ([place]) 2005   History reviewed. No pertinent past surgical history. Family History  Problem Relation Age of Onset  . Lupus Father   . Kidney failure Father     associated with Lupus  . Aneurysm Paternal Grandmother   . Alcohol abuse Paternal Grandmother   . Breast cancer Maternal Grandmother   . Cancer Paternal Grandfather     unsure type   Social History  Substance Use Topics  . Smoking status: Never Smoker   . Smokeless tobacco: Never Used  . Alcohol Use: 1.2 - 3.6 oz/week    2-6 Standard drinks or equivalent per week     Comment: weekends   OB History    Gravida Para Term Preterm AB TAB SAB Ectopic Multiple Living   0 0 0 0 0 0 0 0 0 0      Review of Systems  Gastrointestinal: Positive for vomiting.  All other systems reviewed and are negative.     Allergies  Review of patient's allergies indicates no known allergies.  Home Medications   Prior to Admission medications   Medication Sig Start Date End Date Taking? Authorizing Provider  belimumab (BENLYSTA) 120 MG SOLR injection Inject 10 mg/kg into the vein every 30 (thirty) days.     Historical Provider, MD  DULoxetine (CYMBALTA) 30 MG capsule Take 1 capsule by mouth daily. 04/05/15   Historical Provider, MD  Hydrocodone-Acetaminophen (VICODIN) 5-300 MG TABS Take 1 tablet by mouth every 4 (four) hours as needed. 06/13/14   Nunzio Cobbs, MD  hydroxychloroquine (PLAQUENIL) 200 MG tablet  05/10/14   Historical Provider, MD  hydrOXYzine (ATARAX/VISTARIL) 10 MG tablet Take 1 tablet by mouth daily. 03/05/15   Historical Provider, MD  Levonorgestrel 13.5 MG IUD by Intrauterine route.    Historical Provider, MD  predniSONE (DELTASONE) 10 MG tablet Take 7.5 mg by mouth daily.     Historical Provider, MD  REVLIMID 10 MG capsule  05/13/14   Historical Provider, MD  SUMAtriptan (IMITREX) 25 MG tablet Take 1 tab by mouth as needed. May repeat in 2 hrs if migraine persists. PATIENT NEEDS OFFICE VISIT FOR ADDITIONAL REFILLS 10/25/14   Robyn Haber, MD   BP 110/76 mmHg  Pulse 87  Temp(Src) 98.6 F (37 C) (Oral)  SpO2 100% Physical Exam  Constitutional: She is oriented to person, place, and time. She appears well-developed and well-nourished.  Non-toxic appearance. No distress.  HENT:  Head: Normocephalic and atraumatic.  Eyes: Conjunctivae, EOM and lids are normal. Pupils are equal, round, and reactive to light.  Neck: Normal range of motion. Neck supple. No tracheal deviation present. No thyroid mass present.  Cardiovascular: Normal rate,  regular rhythm and normal heart sounds.  Exam reveals no gallop.   No murmur heard. Pulmonary/Chest: Effort normal and breath sounds normal. No stridor. No respiratory distress. She has no decreased breath sounds. She has no wheezes. She has no rhonchi. She has no rales.  Abdominal: Soft. Normal appearance and bowel sounds are normal. She exhibits no distension. There is no tenderness. There is no rigidity, no rebound, no guarding and no CVA tenderness.  Musculoskeletal: Normal range of motion. She exhibits no edema or tenderness.   Neurological: She is alert and oriented to person, place, and time. She has normal strength. No cranial nerve deficit or sensory deficit. GCS eye subscore is 4. GCS verbal subscore is 5. GCS motor subscore is 6.  Skin: Skin is warm and dry. No abrasion and no rash noted.  Psychiatric: She has a normal mood and affect. Her speech is normal and behavior is normal.  Nursing note and vitals reviewed.   ED Course  Procedures (including critical care time) Labs Review Labs Reviewed  LIPASE, BLOOD  COMPREHENSIVE METABOLIC PANEL  CBC  URINALYSIS, ROUTINE W REFLEX MICROSCOPIC (NOT AT [facility])    Imaging Review No results found. I have personally reviewed and evaluated these images and lab results as part of my medical decision-making.   EKG Interpretation None      MDM   Final diagnoses:  None    Patient given IV fluids and medications and feels better. 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

## 2015-07-23 NOTE — ED Notes (Signed)
Pt states N/V with headache started last night. Pt states vomited 4 times in 24 hours. Denies fever and stomach pain

## 2015-08-24 ENCOUNTER — Encounter: Payer: Self-pay | Admitting: Obstetrics and Gynecology

## 2015-08-24 ENCOUNTER — Ambulatory Visit (INDEPENDENT_AMBULATORY_CARE_PROVIDER_SITE_OTHER): Payer: BC Managed Care – PPO | Admitting: Obstetrics and Gynecology

## 2015-08-24 VITALS — BP 110/64 | HR 72 | Resp 16 | Wt 133.0 lb

## 2015-08-24 DIAGNOSIS — A499 Bacterial infection, unspecified: Secondary | ICD-10-CM | POA: Diagnosis not present

## 2015-08-24 DIAGNOSIS — N760 Acute vaginitis: Secondary | ICD-10-CM | POA: Diagnosis not present

## 2015-08-24 DIAGNOSIS — L293 Anogenital pruritus, unspecified: Secondary | ICD-10-CM

## 2015-08-24 DIAGNOSIS — L309 Dermatitis, unspecified: Secondary | ICD-10-CM | POA: Diagnosis not present

## 2015-08-24 DIAGNOSIS — B9689 Other specified bacterial agents as the cause of diseases classified elsewhere: Secondary | ICD-10-CM

## 2015-08-24 MED ORDER — METRONIDAZOLE 0.75 % VA GEL: 1.0000 | Freq: Every day | VAGINAL | Status: DC

## 2015-08-24 MED ORDER — BETAMETHASONE VALERATE 0.1 % EX OINT: TOPICAL_OINTMENT | CUTANEOUS | Status: DC

## 2015-08-24 NOTE — Progress Notes (Signed)
GYNECOLOGY  VISIT   HPI: 20 y.o.   Single  African American  female   [id number] with No LMP recorded. Patient is not currently having periods (Reason: IUD).   here for   Vaginal irritation, patient thinks it could be razor burn. She c/o a 2 day h/o vaginal irritation and itching. No abnormal discharge. She does c/o a slight vaginal odor.   GYNECOLOGIC HISTORY: No LMP recorded. Patient is not currently having periods (Reason: IUD). Contraception:IUD Menopausal hormone therapy: n/a        OB History    Gravida Para Term Preterm AB TAB SAB Ectopic Multiple Living   0 0 0 0 0 0 0 0 0 0          Patient Active Problem List   Diagnosis Date Noted  . History of urinary tract infection 05/15/2012  . COMMON MIGRAINE 09/10/2006  . Systemic lupus erythematosus ([place]) 09/10/2006    Past Medical History  Diagnosis Date  . Lupus ([place]) 2005    History reviewed. No pertinent past surgical history.  Current Outpatient Prescriptions  Medication Sig Dispense Refill  . belimumab (BENLYSTA) 120 MG SOLR injection Inject 10 mg/kg into the vein every 30 (thirty) days.    . DULoxetine (CYMBALTA) 30 MG capsule Take 1 capsule by mouth daily.  2  . hydroxychloroquine (PLAQUENIL) 200 MG tablet Take 200 mg by mouth daily.     . hydrOXYzine (ATARAX/VISTARIL) 25 MG tablet Take 25 mg by mouth 3 (three) times daily.     . Levonorgestrel 13.5 MG IUD by Intrauterine route.    . predniSONE (DELTASONE) 10 MG tablet Take 5 mg by mouth daily.     Marland Kitchen REVLIMID 10 MG capsule Take 10 mg by mouth daily.     . SUMAtriptan (IMITREX) 25 MG tablet Take 1 tab by mouth as needed. May repeat in 2 hrs if migraine persists. PATIENT NEEDS OFFICE VISIT FOR ADDITIONAL REFILLS 10 tablet 0   No current facility-administered medications for this visit.     ALLERGIES: Review of patient's allergies indicates no known allergies.  Family History  Problem Relation Age of Onset  . Lupus Father   . Kidney failure Father     associated  with Lupus  . Aneurysm Paternal Grandmother   . Alcohol abuse Paternal Grandmother   . Breast cancer Maternal Grandmother   . Cancer Paternal Grandfather     unsure type    Social History   Social History  . Marital Status: Single    Spouse Name: N/A  . Number of Children: N/A  . Years of Education: N/A   Occupational History  . Not on file.   Social History Main Topics  . Smoking status: Never Smoker   . Smokeless tobacco: Never Used  . Alcohol Use: 1.2 - 3.6 oz/week    2-6 Standard drinks or equivalent per week     Comment: weekends  . Drug Use: No  . Sexual Activity:    Partners: Male    Birth Control/ Protection: IUD     Comment: Skyla inserted 06/13/14   Other Topics Concern  . Not on file   Social History Narrative    Review of Systems  Constitutional: Positive for weight loss.       Bilateral breast pain   HENT: Negative.   Eyes: Negative.   Respiratory: Negative.   Cardiovascular: Negative.   Gastrointestinal: Positive for nausea.  Genitourinary:       Abnormal discharge Unscheduled bleeding or spotting Vulvar/vaginal  itching  Musculoskeletal: Negative.   Skin: Positive for itching.  Neurological: Negative.   Endo/Heme/Allergies: Negative.   Psychiatric/Behavioral: Positive for depression.    PHYSICAL EXAMINATION:    BP 110/64 mmHg  Pulse 72  Resp 16  Wt 133 lb (60.328 kg)    General appearance: alert, cooperative and appears stated age  Pelvic: External genitalia:  no lesions, mons with mild erythema              Urethra:  normal appearing urethra with no masses, tenderness or lesions              Bartholins and Skenes: normal                 Vagina: normal appearing vagina with an increase in watery, white vaginal d/c              Cervix: no lesions              Chaperone was present for exam.  Wet prep: + clue, no trich, rare wbc KOH: no yeast PH: 6   ASSESSMENT Bacterial vaginitis Dermatitis, suspect contact irritation from  shaving Genital pruritus from the dermatitis, vaginal slides with BV, no yeast seen    PLAN Metrogel Valisone ointment  Send wet prep probe to r/o yeast in addition to BV   An After Visit Summary was printed and given to the patient.

## 2015-08-24 NOTE — Patient Instructions (Signed)

## 2015-08-25 LAB — WET PREP BY MOLECULAR PROBE
Candida species: NEGATIVE
GARDNERELLA VAGINALIS: POSITIVE — AB
TRICHOMONAS VAG: NEGATIVE

## 2015-08-29 ENCOUNTER — Other Ambulatory Visit: Payer: Self-pay | Admitting: Family Medicine

## 2015-09-09 ENCOUNTER — Ambulatory Visit (INDEPENDENT_AMBULATORY_CARE_PROVIDER_SITE_OTHER): Payer: BC Managed Care – PPO | Admitting: Physician Assistant

## 2015-09-09 VITALS — BP 122/72 | HR 87 | Temp 97.9°F | Resp 17 | Ht 61.5 in | Wt 130.0 lb

## 2015-09-09 DIAGNOSIS — Z87440 Personal history of urinary (tract) infections: Secondary | ICD-10-CM | POA: Insufficient documentation

## 2015-09-09 DIAGNOSIS — R300 Dysuria: Secondary | ICD-10-CM | POA: Diagnosis not present

## 2015-09-09 DIAGNOSIS — R350 Frequency of micturition: Secondary | ICD-10-CM | POA: Diagnosis not present

## 2015-09-09 LAB — POC MICROSCOPIC URINALYSIS (UMFC): Mucus: ABSENT

## 2015-09-09 LAB — POCT URINALYSIS DIP (MANUAL ENTRY)
BILIRUBIN UA: NEGATIVE
BILIRUBIN UA: NEGATIVE
Glucose, UA: NEGATIVE
LEUKOCYTES UA: NEGATIVE
Nitrite, UA: POSITIVE — AB
PH UA: 5
Protein Ur, POC: NEGATIVE
SPEC GRAV UA: 1.015
Urobilinogen, UA: 0.2

## 2015-09-09 MED ORDER — NITROFURANTOIN MONOHYD MACRO 100 MG PO CAPS: 100.0000 mg | ORAL_CAPSULE | Freq: Two times a day (BID) | ORAL | Status: AC

## 2015-09-09 NOTE — Progress Notes (Signed)
Patient ID: Samantha Davies, female    DOB: 1995-03-31, 20 y.o.   MRN: [id number]  PCP: Ann Held, DO  Subjective:   Chief Complaint  Patient presents with  . Dysuria    HPI Presents for evaluation of increased urinary frequency and urgency, as well as dysuria and reporting her urine smells like ham.  She is using a heating pad and increased oral hydration without resolution of her symptoms. Remote history of previous UTI.  Sexually active. Monogamous. Condom use.  She was treated for BV by GYN beginning 6/12, and those symptoms are completely resolved. At that visit, they also discussed increased frequency of vaginal irritation from spermicides, but the patient reports that she wasn't able to find condoms without spermicide in the size her partner needs.  .    Review of Systems As above. No nausea, vomiting, fever, chills, headache, dizziness, abdominal pain or back pain. No atypical vaginal discharge or itching.    Patient Active Problem List   Diagnosis Date Noted  . History of urinary tract infection 05/15/2012  . COMMON MIGRAINE 09/10/2006  . Systemic lupus erythematosus ([place]) 09/10/2006     Prior to Admission medications   Medication Sig Start Date End Date Taking? Authorizing Provider  belimumab (BENLYSTA) 120 MG SOLR injection Inject 10 mg/kg into the vein every 30 (thirty) days.   Yes Historical Provider, MD  DULoxetine (CYMBALTA) 30 MG capsule Take 1 capsule by mouth daily. 04/05/15  Yes Historical Provider, MD  hydroxychloroquine (PLAQUENIL) 200 MG tablet Take 200 mg by mouth daily.  05/10/14  Yes Historical Provider, MD  hydrOXYzine (ATARAX/VISTARIL) 25 MG tablet Take 25 mg by mouth 3 (three) times daily.    Yes Historical Provider, MD  Levonorgestrel 13.5 MG IUD by Intrauterine route.   Yes Historical Provider, MD  predniSONE (DELTASONE) 10 MG tablet Take 5 mg by mouth daily.    Yes Historical Provider, MD  REVLIMID 10 MG capsule Take 10 mg by  mouth daily.  05/13/14  Yes Historical Provider, MD  SUMAtriptan (IMITREX) 25 MG tablet Take 1 tab by mouth as needed. May repeat in 2 hrs if migraine persists. PATIENT NEEDS OFFICE VISIT FOR ADDITIONAL REFILLS 10/25/14  Yes Robyn Haber, MD     No Known Allergies     Objective:  Physical Exam  Constitutional: She is oriented to person, place, and time. She appears well-developed and well-nourished. She is active and cooperative. No distress.  BP 122/72 mmHg  Pulse 87  Temp(Src) 97.9 F (36.6 C) (Oral)  Resp 17  Ht 5' 1.5" (1.562 m)  Wt 130 lb (58.968 kg)  BMI 24.17 kg/m2  SpO2 100%  HENT:  Head: Normocephalic and atraumatic.  Right Ear: Hearing normal.  Left Ear: Hearing normal.  Eyes: Conjunctivae are normal. No scleral icterus.  Neck: Normal range of motion. Neck supple. No thyromegaly present.  Cardiovascular: Normal rate, regular rhythm and normal heart sounds.   Pulses:      Radial pulses are 2+ on the right side, and 2+ on the left side.  Pulmonary/Chest: Effort normal and breath sounds normal.  Abdominal: Normal appearance and bowel sounds are normal. There is no hepatosplenomegaly. There is no tenderness.  Lymphadenopathy:       Head (right side): No tonsillar, no preauricular, no posterior auricular and no occipital adenopathy present.       Head (left side): No tonsillar, no preauricular, no posterior auricular and no occipital adenopathy present.    She has no cervical  adenopathy.       Right: No supraclavicular adenopathy present.       Left: No supraclavicular adenopathy present.  Neurological: She is alert and oriented to person, place, and time. No sensory deficit.  Skin: Skin is warm, dry and intact. No rash noted. No cyanosis or erythema. Nails show no clubbing.  Psychiatric: She has a normal mood and affect. Her speech is normal and behavior is normal.       Results for orders placed or performed in visit on 09/09/15  POCT urinalysis dipstick  Result  Value Ref Range   Color, UA orange (A) yellow   Clarity, UA clear clear   Glucose, UA negative negative   Bilirubin, UA negative negative   Ketones, POC UA negative negative   Spec Grav, UA 1.015    Blood, UA trace-lysed (A) negative   pH, UA 5.0    Protein Ur, POC negative negative   Urobilinogen, UA 0.2    Nitrite, UA Positive (A) Negative   Leukocytes, UA Negative Negative  POCT Microscopic Urinalysis ([facility])  Result Value Ref Range   WBC,UR,HPF,POC Few (A) None WBC/hpf   RBC,UR,HPF,POC None None RBC/hpf   Bacteria Many (A) None, Too numerous to count   Mucus Absent Absent   Epithelial Cells, UR Per Microscopy Few (A) None, Too numerous to count cells/hpf       Assessment & Plan:   1. Dysuria 2. Increased frequency of urination Empiric treatment for presumed UTI with Macrobid. Await UCx. Hydration.  - POCT urinalysis dipstick - POCT Microscopic Urinalysis ([facility]) - nitrofurantoin, macrocrystal-monohydrate, (MACROBID) 100 MG capsule; Take 1 capsule (100 mg total) by mouth 2 (two) times daily.  Dispense: 10 capsule; Refill: 0 - Urine culture    Fara Chute, PA-C Physician Assistant-Certified [facility]

## 2015-09-09 NOTE — Patient Instructions (Addendum)
     IF you received an x-ray today, you will receive an invoice from [facility]. Please contact [facility] at [phone] with questions or concerns regarding your invoice.   IF you received labwork today, you will receive an invoice from [employer]. Please contact [facility] at [phone] with questions or concerns regarding your invoice.   Our billing staff will not be able to assist you with questions regarding bills from these companies.  You will be contacted with the lab results as soon as they are available. The fastest way to get your results is to activate your My Chart account. Instructions are located on the last page of this paperwork. If you have not heard from [place] regarding the results in 2 weeks, please contact this office.      Urinary Tract Infection A urinary tract infection (UTI) can occur any place along the urinary tract. The tract includes the kidneys, ureters, bladder, and urethra. A type of germ called bacteria often causes a UTI. UTIs are often helped with antibiotic medicine.  HOME CARE   If given, take antibiotics as told by your doctor. Finish them even if you start to feel better.  Drink enough fluids to keep your pee (urine) clear or pale yellow.  Avoid tea, drinks with caffeine, and bubbly (carbonated) drinks.  Pee often. Avoid holding your pee in for a long time.  Pee before and after having sex (intercourse).  Wipe from front to back after you poop (bowel movement) if you are a woman. Use each tissue only once. GET HELP RIGHT AWAY IF:   You have back pain.  You have lower belly (abdominal) pain.  You have chills.  You feel sick to your stomach (nauseous).  You throw up (vomit).  Your burning or discomfort with peeing does not go away.  You have a fever.  Your symptoms are not better in 3 days. MAKE SURE YOU:   Understand these instructions.  Will watch your condition.  Will get help  right away if you are not doing well or get worse.   This information is not intended to replace advice given to you by your health care provider. Make sure you discuss any questions you have with your health care provider.   Document Released: 08/17/2007 Document Revised: 03/21/2014 Document Reviewed: 09/29/2011 Elsevier Interactive Patient Education [employer].

## 2015-09-09 NOTE — Progress Notes (Signed)
Subjective:    Patient ID: Samantha Davies, female    DOB: December 22, 1995, 20 y.o.   MRN: [id number]  Chief Complaint  Patient presents with  . Dysuria    HPI  Patient is a 20 yo female who presents today with a 4 day history of increased frequency and burning with urination, malodorous urine "that smells like ham when you first open the package."  Pruritis for first 2 days but has since resolved. Patient has not taken any mediation.   Increased water consumption and using a heated rice bag to relieve the burning sensation.  Admits to sexual intercourse prior to symptom start, without urinating after.   Denies back pain, pelvic pain, hematuria, vaginal discharge, fever, nausea different than baseline.  Dx 2 weeks at [facility] prior with [facility] and completed treatment with metronidazole gel.  States those symptoms have resolved.  At that appointment the provider informed her there has been an increase incidence of irritation of vaginal flora and urethra from spermacide on condoms.  Patient was unable to find condoms without spermicide when she went to the store, because of the size required.  Denies history of STI, new sexual partner, or intercourse without condoms.  History of UTI, but has not had one recently.  Review of Systems All others negative except those listed in HPI.  Patient Active Problem List   Diagnosis Date Noted  . History of urinary tract infection 05/15/2012  . COMMON MIGRAINE 09/10/2006  . Systemic lupus erythematosus ([place]) 09/10/2006    Current Outpatient Prescriptions on File Prior to Visit  Medication Sig Dispense Refill  . belimumab (BENLYSTA) 120 MG SOLR injection Inject 10 mg/kg into the vein every 30 (thirty) days.    . DULoxetine (CYMBALTA) 30 MG capsule Take 1 capsule by mouth daily.  2  . hydroxychloroquine (PLAQUENIL) 200 MG tablet Take 200 mg by mouth daily.     . hydrOXYzine (ATARAX/VISTARIL) 25 MG tablet Take 25 mg by mouth 3 (three) times daily.     .  Levonorgestrel 13.5 MG IUD by Intrauterine route.    . predniSONE (DELTASONE) 10 MG tablet Take 5 mg by mouth daily.     Marland Kitchen REVLIMID 10 MG capsule Take 10 mg by mouth daily.     . SUMAtriptan (IMITREX) 25 MG tablet Take 1 tab by mouth as needed. May repeat in 2 hrs if migraine persists. PATIENT NEEDS OFFICE VISIT FOR ADDITIONAL REFILLS 10 tablet 0   No current facility-administered medications on file prior to visit.   No Known Allergies     Objective: BP 122/72 mmHg  Pulse 87  Temp(Src) 97.9 F (36.6 C) (Oral)  Resp 17  Ht 5' 1.5" (1.562 m)  Wt 130 lb (58.968 kg)  BMI 24.17 kg/m2  SpO2 100%   Physical Exam  Constitutional: She is oriented to person, place, and time. She appears well-developed and well-nourished.  HENT:  Head: Normocephalic and atraumatic.  Eyes: Conjunctivae are normal. Pupils are equal, round, and reactive to light.  Cardiovascular: Normal rate, regular rhythm, normal heart sounds and intact distal pulses.   Pulses:      Radial pulses are 2+ on the right side, and 2+ on the left side.  Pulmonary/Chest: Effort normal and breath sounds normal. No respiratory distress.  Abdominal: Soft. Bowel sounds are normal. She exhibits no distension and no mass. There is no tenderness. There is no rigidity, no rebound and no guarding.  NO CVA tenderness or suprapubic tenderness on palpation.  Neurological: She is  alert and oriented to person, place, and time.  Skin: Skin is warm and dry.  Psychiatric: She has a normal mood and affect. Her behavior is normal. Judgment and thought content normal.    Results for orders placed or performed in visit on 09/09/15  POCT urinalysis dipstick  Result Value Ref Range   Color, UA orange (A) yellow   Clarity, UA clear clear   Glucose, UA negative negative   Bilirubin, UA negative negative   Ketones, POC UA negative negative   Spec Grav, UA 1.015    Blood, UA trace-lysed (A) negative   pH, UA 5.0    Protein Ur, POC negative  negative   Urobilinogen, UA 0.2    Nitrite, UA Positive (A) Negative   Leukocytes, UA Negative Negative      Assessment & Plan:  1. Dysuria 2. Increased frequency of urination - POCT urinalysis dipstick - POCT Microscopic Urinalysis ([facility]) - Urine culture - nitrofurantoin, macrocrystal-monohydrate, (MACROBID) 100 MG capsule; Take 1 capsule (100 mg total) by mouth 2 (two) times daily.  Dispense: 10 capsule; Refill: 0   Discussed lab results with patient indicating UTI present.  Ordered urine culture, labs pending  Prescribed 100mg  Macrobid BID x 5days.  Instructed patient to take antibiotic as prescribed even if she begins to feel better.   Provided education about UTI's, including home care, and alarm symptoms that require additional medical treatment.  Patient instructed to return to clinic if any symptoms worsen or fail to resolve with treatment.  Traniece Boffa P. Glada Wickstrom, PA-S

## 2015-09-11 LAB — URINE CULTURE

## 2015-09-12 ENCOUNTER — Telehealth: Payer: Self-pay | Admitting: Emergency Medicine

## 2015-09-12 NOTE — Telephone Encounter (Signed)
Pt given results and is feeling better

## 2015-09-12 NOTE — Telephone Encounter (Signed)
-----   Message from Harrison Mons, [place] sent at 09/12/2015  9:51 AM EDT ----- Please call patient. UCx confirms UTI and that she is on the correct antibiotic. RTC if symptoms persist.

## 2015-09-17 ENCOUNTER — Other Ambulatory Visit: Payer: Self-pay | Admitting: Obstetrics and Gynecology

## 2015-09-18 NOTE — Telephone Encounter (Signed)
Medication refill request: Betamethasone Valerate ointment Last AEX:  04/10/15 PG; Office visit 08/24/15 JJ Next AEX: 04/15/16  Last MMG (if hormonal medication request): n/a Refill authorized: Please advise

## 2015-09-20 NOTE — Telephone Encounter (Signed)
Please call the patient, the valisone is not meant for long term daily use. She was just given this script last month. Find out how she is using it? She may need to be seen if she is still symptomatic. Thanks

## 2015-09-21 NOTE — Telephone Encounter (Signed)
Left message to call Kaitlyn at [phone]. 

## 2016-01-28 ENCOUNTER — Other Ambulatory Visit: Payer: Self-pay | Admitting: Family Medicine

## 2016-02-10 ENCOUNTER — Encounter (HOSPITAL_COMMUNITY): Payer: Self-pay | Admitting: Emergency Medicine

## 2016-02-10 ENCOUNTER — Telehealth: Payer: Self-pay | Admitting: Nurse Practitioner

## 2016-02-10 ENCOUNTER — Ambulatory Visit (HOSPITAL_COMMUNITY)
Admission: EM | Admit: 2016-02-10 | Discharge: 2016-02-10 | Disposition: A | Discharge status: Home / Self Care | Payer: BC Managed Care – PPO | Attending: Family Medicine | Admitting: Family Medicine

## 2016-02-10 DIAGNOSIS — N3090 Cystitis, unspecified without hematuria: Secondary | ICD-10-CM | POA: Diagnosis not present

## 2016-02-10 LAB — POCT URINALYSIS DIP (DEVICE)
Bilirubin Urine: NEGATIVE
Glucose, UA: NEGATIVE mg/dL
Ketones, ur: NEGATIVE mg/dL
NITRITE: NEGATIVE
PH: 6.5 (ref 5.0–8.0)
PROTEIN: NEGATIVE mg/dL
Specific Gravity, Urine: 1.02 (ref 1.005–1.030)
UROBILINOGEN UA: 0.2 mg/dL (ref 0.0–1.0)

## 2016-02-10 LAB — POCT PREGNANCY, URINE: PREG TEST UR: NEGATIVE

## 2016-02-10 MED ORDER — CEPHALEXIN 500 MG PO CAPS: 500.0000 mg | ORAL_CAPSULE | Freq: Four times a day (QID) | ORAL | 0 refills | Status: DC

## 2016-02-10 NOTE — Telephone Encounter (Signed)
Spoke with patient. Patient states that since last week her urine has had an odor. Reports she now has right sided lower back pain. Feels symptoms are worsening. Noticed pink tinged urine with wiping. Denies fever or chills. Reports she is staying well hydrated. Denies any pain with urination. Advised she will need to be seen for further evaluation. Patient declines OV as she will be working until 6:30 pm today. Advised patient she will need to go to a local urgent care after work for evaluation and treatment. Advised if her symptoms worsen or if she develops new symptoms she will need to be seen for further evaluation with our office earlier or with urgent care. Patient is agreeable.  Routing to provider for final review. Patient agreeable to disposition. Will close encounter.

## 2016-02-10 NOTE — ED Triage Notes (Signed)
Symptoms started last week.  Noticed odor to urine and yesterday started having back pain.  Patient has a headache and nausea.

## 2016-02-10 NOTE — Discharge Instructions (Signed)
Take all of medicine as directed, drink lots of fluids, see your doctor if further problems. °

## 2016-02-10 NOTE — Telephone Encounter (Signed)
Patient called and said, "I think I may have a uti but I am at work and don't get off until 6:30 PM.  What should I do?"

## 2016-02-10 NOTE — ED Provider Notes (Signed)
[facility]    CSN: [id number] Arrival date & time: 02/10/16  1905     History   Chief Complaint Chief Complaint  Patient presents with  . Urinary Tract Infection    HPI Samantha Davies is a 20 y.o. female.   The history is provided by the patient.  Urinary Tract Infection  Pain quality:  Burning Pain severity:  Mild Onset quality:  Gradual Duration:  1 week Progression:  Unchanged Chronicity:  Chronic Recent urinary tract infections: yes   Relieved by:  None tried Worsened by:  Nothing Ineffective treatments:  None tried Associated symptoms: flank pain   Associated symptoms: no abdominal pain, no fever, no nausea and no vomiting   Risk factors: recurrent urinary tract infections     Past Medical History:  Diagnosis Date  . Lupus 2005    Patient Active Problem List   Diagnosis Date Noted  . H/O urinary tract infection 09/09/2015  . 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 History of urinary tract infection 05/15/2012  . COMMON MIGRAINE 09/10/2006  . Systemic lupus erythematosus ([place]) 09/10/2006    History reviewed. No pertinent surgical history.  OB History    Gravida Para Term Preterm AB Living   0 0 0 0 0 0   SAB TAB Ectopic Multiple Live Births   0 0 0 0         Home Medications    Prior to Admission medications   Medication Sig Start Date End Date Taking? Authorizing Provider  belimumab (BENLYSTA) 120 MG SOLR injection Inject 10 mg/kg into the vein every 30 (thirty) days.    Historical Provider, MD  DULoxetine (CYMBALTA) 30 MG capsule Take 1 capsule by mouth daily. 04/05/15   Historical Provider, MD  hydroxychloroquine (PLAQUENIL) 200 MG tablet Take 200 mg by mouth daily.  05/10/14   Historical Provider, MD  hydrOXYzine (ATARAX/VISTARIL) 25 MG tablet Take 25 mg by mouth 3 (three) times daily.     Historical Provider, MD  Levonorgestrel 13.5 MG IUD by Intrauterine route.    Historical Provider, MD    predniSONE (DELTASONE) 10 MG tablet Take 5 mg by mouth daily.     Historical Provider, MD  REVLIMID 10 MG capsule Take 10 mg by mouth daily.  05/13/14   Historical Provider, MD  SUMAtriptan (IMITREX) 25 MG tablet Take 1 tab by mouth as needed. May repeat in 2 hrs if migraine persists. PATIENT NEEDS OFFICE VISIT FOR ADDITIONAL REFILLS 10/25/14   Robyn Haber, MD    Family History Family History  Problem Relation Age of Onset  . Lupus Father   . Kidney failure Father     associated with Lupus  . Aneurysm Paternal Grandmother   . Alcohol abuse Paternal Grandmother   . Breast cancer Maternal Grandmother   . Cancer Paternal Grandfather     unsure type    Social History Social History  Substance Use Topics  . Smoking status: Never Smoker  . Smokeless tobacco: Never Used  . Alcohol use 1.2 - 3.6 oz/week    2 - 6 Standard drinks or equivalent per week     Comment: weekends     Allergies   Patient has no known allergies.   Review of Systems Review of Systems  Constitutional: Negative for fever.  Gastrointestinal: Negative for abdominal pain, diarrhea, nausea and vomiting.  Genitourinary: Positive for dysuria, flank pain and frequency. Negative for menstrual problem and urgency.  All other systems reviewed and are negative.  Physical Exam Triage Vital Signs ED Triage Vitals  Enc Vitals Group     BP 02/10/16 2011 113/83     Pulse Rate 02/10/16 2011 85     Resp 02/10/16 2011 18     Temp 02/10/16 2011 98.7 F (37.1 C)     Temp Source 02/10/16 2011 Oral     SpO2 02/10/16 2011 100 %     Weight --      Height --      Head Circumference --      Peak Flow --      Pain Score 02/10/16 2010 8     Pain Loc --      Pain Edu? --      Excl. in [place]? --    No data found.   Updated Vital Signs BP 113/83 (BP Location: Left Arm)   Pulse 85   Temp 98.7 F (37.1 C) (Oral)   Resp 18   SpO2 100%   Visual Acuity Right Eye Distance:   Left Eye Distance:   Bilateral  Distance:    Right Eye Near:   Left Eye Near:    Bilateral Near:     Physical Exam  Constitutional: She is oriented to person, place, and time. She appears well-developed and well-nourished. No distress.  Pulmonary/Chest: Effort normal and breath sounds normal.  Abdominal: Soft. Bowel sounds are normal. She exhibits no distension and no mass. There is no tenderness. There is no rebound and no guarding. No hernia.  Neurological: She is alert and oriented to person, place, and time.  Skin: Skin is warm and dry.  Nursing note and vitals reviewed.    UC Treatments / Results  Labs (all labs ordered are listed, but only abnormal results are displayed) Labs Reviewed  POCT URINALYSIS DIP (DEVICE) - Abnormal; Notable for the following:       Result Value   Hgb urine dipstick TRACE (*)    Leukocytes, UA TRACE (*)    All other components within normal limits    EKG  EKG Interpretation None       Radiology No results found.  Procedures Procedures (including critical care time)  Medications Ordered in UC Medications - No data to display   Initial Impression / Assessment and Plan / UC Course  I have reviewed the triage vital signs and the nursing notes.  Pertinent labs & imaging results that were available during my care of the patient were reviewed by me and considered in my medical decision making (see chart for details).  Clinical Course      Final Clinical Impressions(s) / UC Diagnoses   Final diagnoses:  None    New Prescriptions New Prescriptions   No medications on file     Billy Fischer, MD 02/10/16 2035

## 2016-04-15 ENCOUNTER — Ambulatory Visit: Payer: BC Managed Care – PPO | Admitting: Nurse Practitioner

## 2016-04-15 ENCOUNTER — Encounter: Payer: Self-pay | Admitting: Nurse Practitioner

## 2016-04-15 NOTE — Progress Notes (Deleted)
Patient ID: Samantha Davies, female   DOB: 20-Feb-1996, 21 y.o.   MRN: [id number]  21 y.o. G0P0000 Single  {Race/ethnicity:17218} Fe here for annual exam.    No LMP recorded. Patient is not currently having periods (Reason: IUD).          Sexually active: {yes no:314532}  The current method of family planning is condoms {:310972} and IUD.  Skyla inserted 06/13/14. Exercising: {yes no:314532}  {types:19826} Smoker:  no  Health Maintenance: Pap:  04/10/15, ASCUS with pos HR HPV TDaP:  *** HIV: 03/26/14 Labs: ***   reports that she has never smoked. She has never used smokeless tobacco. She reports that she drinks about 1.2 - 3.6 oz of alcohol per week . She reports that she does not use drugs.  Past Medical History:  Diagnosis Date  . Lupus 2005    No past surgical history on file.  Current Outpatient Prescriptions  Medication Sig Dispense Refill  . belimumab (BENLYSTA) 120 MG SOLR injection Inject 10 mg/kg into the vein every 30 (thirty) days.    . cephALEXin (KEFLEX) 500 MG capsule Take 1 capsule (500 mg total) by mouth 4 (four) times daily. Take all of medicine and drink lots of fluids 20 capsule 0  . DULoxetine (CYMBALTA) 30 MG capsule Take 1 capsule by mouth daily.  2  . hydroxychloroquine (PLAQUENIL) 200 MG tablet Take 200 mg by mouth daily.     . hydrOXYzine (ATARAX/VISTARIL) 25 MG tablet Take 25 mg by mouth 3 (three) times daily.     . Levonorgestrel 13.5 MG IUD by Intrauterine route.    . predniSONE (DELTASONE) 10 MG tablet Take 5 mg by mouth daily.     Marland Kitchen REVLIMID 10 MG capsule Take 10 mg by mouth daily.     . SUMAtriptan (IMITREX) 25 MG tablet Take 1 tab by mouth as needed. May repeat in 2 hrs if migraine persists. PATIENT NEEDS OFFICE VISIT FOR ADDITIONAL REFILLS 10 tablet 0   No current facility-administered medications for this visit.     Family History  Problem Relation Age of Onset  . Lupus Father   . Kidney failure Father     associated with Lupus  . Aneurysm  Paternal Grandmother   . Alcohol abuse Paternal Grandmother   . Breast cancer Maternal Grandmother   . Cancer Paternal Grandfather     unsure type    ROS:  Pertinent items are noted in HPI.  Otherwise, a comprehensive ROS was negative.  Exam:   There were no vitals taken for this visit.   Ht Readings from Last 3 Encounters:  09/09/15 5' 1.5" (1.562 m)  04/10/15 5\' 1"  (1.549 m)  12/26/14 5\' 1"  (1.549 m)    General appearance: alert, cooperative and appears stated age Head: Normocephalic, without obvious abnormality, atraumatic Neck: no adenopathy, supple, symmetrical, trachea midline and thyroid {EXAM; THYROID:18604} Lungs: clear to auscultation bilaterally Breasts: {Exam; breast:13139::"normal appearance, no masses or tenderness"} Heart: regular rate and rhythm Abdomen: soft, non-tender; no masses,  no organomegaly Extremities: extremities normal, atraumatic, no cyanosis or edema Skin: Skin color, texture, turgor normal. No rashes or lesions Lymph nodes: Cervical, supraclavicular, and axillary nodes normal. No abnormal inguinal nodes palpated Neurologic: Grossly normal   Pelvic: External genitalia:  no lesions              Urethra:  normal appearing urethra with no masses, tenderness or lesions              Bartholin's and Skene's: normal  Vagina: normal appearing vagina with normal color and discharge, no lesions              Cervix: {exam; cervix:14595}              Pap taken: {yes no:314532} Bimanual Exam:  Uterus:  {exam; uterus:12215}              Adnexa: {exam; adnexa:12223}               Rectovaginal: Confirms               Anus:  normal sphincter tone, no lesions  Chaperone present: ***  A:  Well Woman with normal exam  P:   Reviewed health and wellness pertinent to exam  Pap smear as above  {plan; gyn:5269::"mammogram","pap smear","return annually or prn"}  An After Visit Summary was printed and given to the patient.

## 2016-05-10 ENCOUNTER — Ambulatory Visit (INDEPENDENT_AMBULATORY_CARE_PROVIDER_SITE_OTHER): Payer: BC Managed Care – PPO | Admitting: Nurse Practitioner

## 2016-05-10 ENCOUNTER — Encounter: Payer: Self-pay | Admitting: Nurse Practitioner

## 2016-05-10 VITALS — BP 92/60 | HR 64 | Resp 12 | Ht 61.75 in | Wt 113.4 lb

## 2016-05-10 DIAGNOSIS — R87619 Unspecified abnormal cytological findings in specimens from cervix uteri: Secondary | ICD-10-CM

## 2016-05-10 DIAGNOSIS — Z0000 Encounter for general adult medical examination without abnormal findings: Secondary | ICD-10-CM

## 2016-05-10 DIAGNOSIS — Z01419 Encounter for gynecological examination (general) (routine) without abnormal findings: Secondary | ICD-10-CM

## 2016-05-10 DIAGNOSIS — N760 Acute vaginitis: Secondary | ICD-10-CM

## 2016-05-10 DIAGNOSIS — Z30431 Encounter for routine checking of intrauterine contraceptive device: Secondary | ICD-10-CM | POA: Diagnosis not present

## 2016-05-10 NOTE — Progress Notes (Signed)
21 y.o. G0P0000 Single  African American Fe here for annual exam.  Same partner for 4 yrs.  Now only spotting every few months.  IUD is scheduled to be changed 06/2017.  Currently flare of Lupus for 3 weeks.  Now back on steroids.   Now graduated but is able to go to work.  Sleep is OK.    Patient's last menstrual period was 04/19/2016 (approximate).          Sexually active: Yes.    The current method of family planning is IUD.   Skyla  IUD inserted 06/13/2014 Exercising: No.  The patient does not participate in regular exercise at present. Smoker:  no  Health Maintenance: Pap:  04/10/15 ASCUS (+) HR HPV TDaP:  UTD? 2013? HIV: 03/26/14 Labs: discuss with provider    reports that she has never smoked. She has never used smokeless tobacco. She reports that she drinks about 1.2 - 3.6 oz of alcohol per week . She reports that she does not use drugs.  Past Medical History:  Diagnosis Date  . Lupus 2005    History reviewed. No pertinent surgical history.  Current Outpatient Prescriptions  Medication Sig Dispense Refill  . belimumab (BENLYSTA) 120 MG SOLR injection Inject 10 mg/kg into the vein every 30 (thirty) days.    . DULoxetine (CYMBALTA) 30 MG capsule Take 1 capsule by mouth daily.  2  . hydroxychloroquine (PLAQUENIL) 200 MG tablet Take 200 mg by mouth daily.     . hydrOXYzine (ATARAX/VISTARIL) 25 MG tablet Take 50 mg by mouth as needed.     . Levonorgestrel 13.5 MG IUD by Intrauterine route.    . Multiple Vitamins-Minerals (MULTIVITAMIN GUMMIES ADULT PO) Take by mouth daily.    . predniSONE (DELTASONE) 10 MG tablet Take 20 mg by mouth daily.     Marland Kitchen REVLIMID 10 MG capsule Take 10 mg by mouth daily.     . SUMAtriptan (IMITREX) 25 MG tablet Take 1 tab by mouth as needed. May repeat in 2 hrs if migraine persists. PATIENT NEEDS OFFICE VISIT FOR ADDITIONAL REFILLS 10 tablet 0   No current facility-administered medications for this visit.     Family History  Problem Relation Age of Onset   . Lupus Father   . Kidney failure Father     associated with Lupus  . Aneurysm Paternal Grandmother   . Alcohol abuse Paternal Grandmother   . Breast cancer Maternal Grandmother   . Cancer Paternal Grandfather     unsure type    ROS:  Pertinent items are noted in HPI.  Otherwise, a comprehensive ROS was negative.  Exam:   BP 92/60 (BP Location: Right Arm, Patient Position: Sitting, Cuff Size: Normal)   Pulse 64   Resp 12   Ht 5' 1.75" (1.568 m)   Wt 113 lb 6.4 oz (51.4 kg)   LMP 04/19/2016 (Approximate)   BMI 20.91 kg/m  Height: 5' 1.75" (156.8 cm) Ht Readings from Last 3 Encounters:  05/10/16 5' 1.75" (1.568 m)  09/09/15 5' 1.5" (1.562 m)  04/10/15 5\' 1"  (1.549 m)    General appearance: alert, cooperative and appears stated age Head: Normocephalic, without obvious abnormality, atraumatic Neck: no adenopathy, supple, symmetrical, trachea midline and thyroid normal to inspection and palpation Lungs: clear to auscultation bilaterally Breasts: normal appearance, no masses or tenderness Heart: regular rate and rhythm Abdomen: soft, non-tender; no masses,  no organomegaly Extremities: extremities normal, atraumatic, no cyanosis or edema Skin: Skin color, texture, turgor normal.  lesions  consistent with Lupus on arms, legs. Lymph nodes: Cervical, supraclavicular, and axillary nodes normal. No abnormal inguinal nodes palpated Neurologic: Grossly normal   Pelvic: External genitalia:  no lesions              Urethra:  normal appearing urethra with no masses, tenderness or lesions              Bartholin's and Skene's: normal                 Vagina: normal appearing vagina with normal color and white discharge, no lesions              Cervix: anteverted              Pap taken: Yes.   Bimanual Exam:  Uterus:  normal size, contour, position, consistency, mobility, non-tender              Adnexa: no mass, fullness, tenderness               Rectovaginal: Confirms                Anus:  normal sphincter tone, no lesions  Chaperone present: yes  A:  Well Woman with normal exam  Skyla IUD 4/1/22016 History of Lupus with multiple skin lesions  History of ASCUS with + HPV 2017  R/O vaginitis  P:   Reviewed health and wellness pertinent to exam  Pap smear as above  Follow with [facility]  Counseled on breast self exam, STD prevention, HIV risk factors and prevention, adequate intake of calcium and vitamin D, diet and exercise return annually or prn  An After Visit Summary was printed and given to the patient.

## 2016-05-10 NOTE — Patient Instructions (Signed)
General topics  Next pap or exam is  due in 1 year Take a Women's multivitamin Take 1200 mg. of calcium daily - prefer dietary If any concerns in interim to call back  Breast Self-Awareness Practicing breast self-awareness may pick up problems early, prevent significant medical complications, and possibly save your life. By practicing breast self-awareness, you can become familiar with how your breasts look and feel and if your breasts are changing. This allows you to notice changes early. It can also offer you some reassurance that your breast health is good. One way to learn what is normal for your breasts and whether your breasts are changing is to do a breast self-exam. If you find a lump or something that was not present in the past, it is best to contact your caregiver right away. Other findings that should be evaluated by your caregiver include nipple discharge, especially if it is bloody; skin changes or reddening; areas where the skin seems to be pulled in (retracted); or new lumps and bumps. Breast pain is seldom associated with cancer (malignancy), but should also be evaluated by a caregiver. BREAST SELF-EXAM The best time to examine your breasts is 5 7 days after your menstrual period is over.  ExitCare Patient Information 2013 [employer].   Exercise to Stay Healthy Exercise helps you become and stay healthy. EXERCISE IDEAS AND TIPS Choose exercises that:  You enjoy.  Fit into your day. You do not need to exercise really hard to be healthy. You can do exercises at a slow or medium level and stay healthy. You can:  Stretch before and after working out.  Try yoga, Pilates, or tai chi.  Lift weights.  Walk fast, swim, jog, run, climb stairs, bicycle, dance, or rollerskate.  Take aerobic classes. Exercises that burn about 150 calories:  Running 1  miles in 15 minutes.  Playing volleyball for 45 to 60 minutes.  Washing and waxing a car for 45 to 60  minutes.  Playing touch football for 45 minutes.  Walking 1  miles in 35 minutes.  Pushing a stroller 1  miles in 30 minutes.  Playing basketball for 30 minutes.  Raking leaves for 30 minutes.  Bicycling 5 miles in 30 minutes.  Walking 2 miles in 30 minutes.  Dancing for 30 minutes.  Shoveling snow for 15 minutes.  Swimming laps for 20 minutes.  Walking up stairs for 15 minutes.  Bicycling 4 miles in 15 minutes.  Gardening for 30 to 45 minutes.  Jumping rope for 15 minutes.  Washing windows or floors for 45 to 60 minutes. Document Released: 04/02/2010 Document Revised: 05/23/2011 Document Reviewed: 04/02/2010 ExitCare Patient Information 2013 [employer].   Other topics ( that may be useful information):    Sexually Transmitted Disease Sexually transmitted disease (STD) refers to any infection that is passed from person to person during sexual activity. This may happen by way of saliva, semen, blood, vaginal mucus, or urine. Common STDs include:  Gonorrhea.  Chlamydia.  Syphilis.  HIV/AIDS.  Genital herpes.  Hepatitis B and C.  Trichomonas.  Human papillomavirus (HPV).  Pubic lice. CAUSES  An STD may be spread by bacteria, virus, or parasite. A person can get an STD by:  Sexual intercourse with an infected person.  Sharing sex toys with an infected person.  Sharing needles with an infected person.  Having intimate contact with the genitals, mouth, or rectal areas of an infected person. SYMPTOMS  Some people may not have any symptoms, but   they can still pass the infection to others. Different STDs have different symptoms. Symptoms include:  Painful or bloody urination.  Pain in the pelvis, abdomen, vagina, anus, throat, or eyes.  Skin rash, itching, irritation, growths, or sores (lesions). These usually occur in the genital or anal area.  Abnormal vaginal discharge.  Penile discharge in men.  Soft, flesh-colored skin growths in the  genital or anal area.  Fever.  Pain or bleeding during sexual intercourse.  Swollen glands in the groin area.  Yellow skin and eyes (jaundice). This is seen with hepatitis. DIAGNOSIS  To make a diagnosis, your caregiver may:  Take a medical history.  Perform a physical exam.  Take a specimen (culture) to be examined.  Examine a sample of discharge under a microscope.  Perform blood test TREATMENT   Chlamydia, gonorrhea, trichomonas, and syphilis can be cured with antibiotic medicine.  Genital herpes, hepatitis, and HIV can be treated, but not cured, with prescribed medicines. The medicines will lessen the symptoms.  Genital warts from HPV can be treated with medicine or by freezing, burning (electrocautery), or surgery. Warts may come back.  HPV is a virus and cannot be cured with medicine or surgery.However, abnormal areas may be followed very closely by your caregiver and may be removed from the cervix, vagina, or vulva through office procedures or surgery. If your diagnosis is confirmed, your recent sexual partners need treatment. This is true even if they are symptom-free or have a negative culture or evaluation. They should not have sex until their caregiver says it is okay. HOME CARE INSTRUCTIONS  All sexual partners should be informed, tested, and treated for all STDs.  Take your antibiotics as directed. Finish them even if you start to feel better.  Only take over-the-counter or prescription medicines for pain, discomfort, or fever as directed by your caregiver.  Rest.  Eat a balanced diet and drink enough fluids to keep your urine clear or pale yellow.  Do not have sex until treatment is completed and you have followed up with your caregiver. STDs should be checked after treatment.  Keep all follow-up appointments, Pap tests, and blood tests as directed by your caregiver.  Only use latex condoms and water-soluble lubricants during sexual activity. Do not use  petroleum jelly or oils.  Avoid alcohol and illegal drugs.  Get vaccinated for HPV and hepatitis. If you have not received these vaccines in the past, talk to your caregiver about whether one or both might be right for you.  Avoid risky sex practices that can break the skin. The only way to avoid getting an STD is to avoid all sexual activity.Latex condoms and dental dams (for oral sex) will help lessen the risk of getting an STD, but will not completely eliminate the risk. SEEK MEDICAL CARE IF:   You have a fever.  You have any new or worsening symptoms. Document Released: 05/21/2002 Document Revised: 05/23/2011 Document Reviewed: 05/28/2010 [facility] Patient Information 2013 [employer].    Domestic Abuse You are being battered or abused if someone close to you hits, pushes, or physically hurts you in any way. You also are being abused if you are forced into activities. You are being sexually abused if you are forced to have sexual contact of any kind. You are being emotionally abused if you are made to feel worthless or if you are constantly threatened. It is important to remember that help is available. No one has the right to abuse you. PREVENTION OF FURTHER  ABUSE  Learn the warning signs of danger. This varies with situations but may include: the use of alcohol, threats, isolation from friends and family, or forced sexual contact. Leave if you feel that violence is going to occur.  If you are attacked or beaten, report it to the police so the abuse is documented. You do not have to press charges. The police can protect you while you or the attackers are leaving. Get the officer's name and badge number and a copy of the report.  Find someone you can trust and tell them what is happening to you: your caregiver, a nurse, clergy member, close friend or family member. Feeling ashamed is natural, but remember that you have done nothing wrong. No one deserves abuse. Document Released:  02/26/2000 Document Revised: 05/23/2011 Document Reviewed: 05/06/2010 ExitCare Patient Information 2013 [employer].    How Much is Too Much Alcohol? Drinking too much alcohol can cause injury, accidents, and health problems. These types of problems can include:   Car crashes.  Falls.  Family fighting (domestic violence).  Drowning.  Fights.  Injuries.  Burns.  Damage to certain organs.  Having a baby with birth defects. ONE DRINK CAN BE TOO MUCH WHEN YOU ARE:  Working.  Pregnant or breastfeeding.  Taking medicines. Ask your doctor.  Driving or planning to drive. If you or someone you know has a drinking problem, get help from a doctor.  Document Released: 12/25/2008 Document Revised: 05/23/2011 Document Reviewed: 12/25/2008 ExitCare Patient Information 2013 [employer].   Smoking Hazards Smoking cigarettes is extremely bad for your health. Tobacco smoke has over 200 known poisons in it. There are over 60 chemicals in tobacco smoke that cause cancer. Some of the chemicals found in cigarette smoke include:   Cyanide.  Benzene.  Formaldehyde.  Methanol (wood alcohol).  Acetylene (fuel used in welding torches).  Ammonia. Cigarette smoke also contains the poisonous gases nitrogen oxide and carbon monoxide.  Cigarette smokers have an increased risk of many serious medical problems and Smoking causes approximately:  90% of all lung cancer deaths in men.  80% of all lung cancer deaths in women.  90% of deaths from chronic obstructive lung disease. Compared with nonsmokers, smoking increases the risk of:  Coronary heart disease by 2 to 4 times.  Stroke by 2 to 4 times.  Men developing lung cancer by 23 times.  Women developing lung cancer by 13 times.  Dying from chronic obstructive lung diseases by 12 times.  . Smoking is the most preventable cause of death and disease in our society.  WHY IS SMOKING ADDICTIVE?  Nicotine is the chemical  agent in tobacco that is capable of causing addiction or dependence.  When you smoke and inhale, nicotine is absorbed rapidly into the bloodstream through your lungs. Nicotine absorbed through the lungs is capable of creating a powerful addiction. Both inhaled and non-inhaled nicotine may be addictive.  Addiction studies of cigarettes and spit tobacco show that addiction to nicotine occurs mainly during the teen years, when young people begin using tobacco products. WHAT ARE THE BENEFITS OF QUITTING?  There are many health benefits to quitting smoking.   Likelihood of developing cancer and heart disease decreases. Health improvements are seen almost immediately.  Blood pressure, pulse rate, and breathing patterns start returning to normal soon after quitting. QUITTING SMOKING   American Lung Association - 1-800-LUNGUSA  American Cancer Society - 1-800-ACS-2345 Document Released: 04/07/2004 Document Revised: 05/23/2011 Document Reviewed: 12/10/2008 ExitCare Patient Information 2013 ExitCare[employer]   LL[employer].   Stress Management Stress is a state of physical or mental tension that often results from changes in your life or normal routine. Some common causes of stress are:  Death of a loved one.  Injuries or severe illnesses.  Getting fired or changing jobs.  Moving into a new home. Other causes may be:  Sexual problems.  Business or financial losses.  Taking on a large debt.  Regular conflict with someone at home or at work.  Constant tiredness from lack of sleep. It is not just bad things that are stressful. It may be stressful to:  Win the lottery.  Get married.  Buy a new car. The amount of stress that can be easily tolerated varies from person to person. Changes generally cause stress, regardless of the types of change. Too much stress can affect your health. It may lead to physical or emotional problems. Too little stress (boredom) may also become stressful. SUGGESTIONS TO  REDUCE STRESS:  Talk things over with your family and friends. It often is helpful to share your concerns and worries. If you feel your problem is serious, you may want to get help from a professional counselor.  Consider your problems one at a time instead of lumping them all together. Trying to take care of everything at once may seem impossible. List all the things you need to do and then start with the most important one. Set a goal to accomplish 2 or 3 things each day. If you expect to do too many in a single day you will naturally fail, causing you to feel even more stressed.  Do not use alcohol or drugs to relieve stress. Although you may feel better for a short time, they do not remove the problems that caused the stress. They can also be habit forming.  Exercise regularly - at least 3 times per week. Physical exercise can help to relieve that "uptight" feeling and will relax you.  The shortest distance between despair and hope is often a good night's sleep.  Go to bed and get up on time allowing yourself time for appointments without being rushed.  Take a short "time-out" period from any stressful situation that occurs during the day. Close your eyes and take some deep breaths. Starting with the muscles in your face, tense them, hold it for a few seconds, then relax. Repeat this with the muscles in your neck, shoulders, hand, stomach, back and legs.  Take good care of yourself. Eat a balanced diet and get plenty of rest.  Schedule time for having fun. Take a break from your daily routine to relax. HOME CARE INSTRUCTIONS   Call if you feel overwhelmed by your problems and feel you can no longer manage them on your own.  Return immediately if you feel like hurting yourself or someone else. Document Released: 08/24/2000 Document Revised: 05/23/2011 Document Reviewed: 04/16/2007 ExitCare Patient Information 2013 ExitCare[employer].  

## 2016-05-11 ENCOUNTER — Telehealth: Payer: Self-pay | Admitting: *Deleted

## 2016-05-11 LAB — WET PREP BY MOLECULAR PROBE
Candida species: NOT DETECTED
GARDNERELLA VAGINALIS: DETECTED — AB
Trichomonas vaginosis: NOT DETECTED

## 2016-05-11 NOTE — Progress Notes (Signed)
Encounter reviewed by Dr. Brook Amundson C. Silva.  

## 2016-05-11 NOTE — Telephone Encounter (Signed)
-----   Message from [place], [place] sent at 05/11/2016  7:51 AM EST ----- Please let pt know that Affirm is positive for BV, negative for yeast.  She may have either Metrogel or Flagyl.  Give her GI and ETOH precautions.

## 2016-05-11 NOTE — Telephone Encounter (Signed)
I have attempted to contact this patient by phone with the following results: left message to return call to [place] at [phone] on answering machine (mobile per [facility]). Name verified in [place].  Advised call was regarding lab results.  [phone] (Mobile) *Preferred*

## 2016-05-12 MED ORDER — METRONIDAZOLE 0.75 % VA GEL: 1.0000 | Freq: Every day | VAGINAL | 0 refills | Status: DC

## 2016-05-12 NOTE — Telephone Encounter (Signed)
Spoke with patient. Results given as seen below. Patient verbalizes understanding. Would like to start on Metrogel at this time. Rx for Metrogel place 1 applicator at bedtime nightly x 5 nights sent to pharmacy on file. Patient verbalizes understanding. Avoid alcohol during treatment and 24 hours after completing medication. Don't mix with alcohol if mixed can cause severe nausea, vomiting and abdominal cramping.  Routing to provider for final review. Patient agreeable to disposition. Will close encounter.

## 2016-05-13 LAB — IPS PAP TEST WITH HPV

## 2017-01-28 ENCOUNTER — Ambulatory Visit (HOSPITAL_COMMUNITY)
Admission: EM | Admit: 2017-01-28 | Discharge: 2017-01-28 | Disposition: A | Discharge status: Home / Self Care | Payer: 59 | Attending: Family Medicine | Admitting: Family Medicine

## 2017-01-28 ENCOUNTER — Encounter (HOSPITAL_COMMUNITY): Payer: Self-pay | Admitting: *Deleted

## 2017-01-28 DIAGNOSIS — N3000 Acute cystitis without hematuria: Secondary | ICD-10-CM | POA: Diagnosis not present

## 2017-01-28 DIAGNOSIS — Z3202 Encounter for pregnancy test, result negative: Secondary | ICD-10-CM

## 2017-01-28 DIAGNOSIS — Z113 Encounter for screening for infections with a predominantly sexual mode of transmission: Secondary | ICD-10-CM | POA: Insufficient documentation

## 2017-01-28 LAB — POCT URINALYSIS DIP (DEVICE)
Bilirubin Urine: NEGATIVE
GLUCOSE, UA: NEGATIVE mg/dL
Ketones, ur: NEGATIVE mg/dL
Nitrite: POSITIVE — AB
PROTEIN: 30 mg/dL — AB
Specific Gravity, Urine: 1.02 (ref 1.005–1.030)
UROBILINOGEN UA: 0.2 mg/dL (ref 0.0–1.0)
pH: 5.5 (ref 5.0–8.0)

## 2017-01-28 LAB — POCT PREGNANCY, URINE: PREG TEST UR: NEGATIVE

## 2017-01-28 MED ORDER — SULFAMETHOXAZOLE-TRIMETHOPRIM 800-160 MG PO TABS: 1.0000 | ORAL_TABLET | Freq: Two times a day (BID) | ORAL | 0 refills | Status: AC

## 2017-01-28 NOTE — ED Triage Notes (Signed)
Patient reports 3-4 days of polyuria, denies dysuria/abdominal pain/vaginal discharge/fever/flank pain. Patient reports hx of UTIs.  Would also like to be tested for STDs.

## 2017-01-28 NOTE — ED Provider Notes (Signed)
[place]    ASSESSMENT & PLAN:  1. Acute cystitis without hematuria   2. Screen for STD (sexually transmitted disease)     Meds ordered this encounter  Medications  . sulfamethoxazole-trimethoprim (BACTRIM DS,SEPTRA DS) 800-160 MG tablet    Sig: Take 1 tablet 2 (two) times daily for 5 days by mouth.    Dispense:  10 tablet    Refill:  0   Urine culture sent. Will notify patient of any positive results. Will follow up with her PCP or here if not showing improvement over the next 48 hours, sooner if needed.  Requests STD testing. Urine cytology sent. No symptoms.  Outlined signs and symptoms indicating need for more acute intervention. Patient verbalized understanding. After Visit Summary given.  SUBJECTIVE:  Samantha Davies is a 21 y.o. female who complains of urinary frequency, urgency and dysuria for the past 3-4 days. No flank pain, fever, chills, abnormal vaginal discharge or bleeding. Hematuria: not present.  Normal PO intake. No flank or abdominal pain. No self treatment.  LMP: No LMP recorded. Patient is not currently having periods (Reason: IUD).   Requests STD testing. No symptoms. Sexually active with one female partner. Occasional condom use.  ROS: As in HPI.  OBJECTIVE:  Vitals:   01/28/17 1432  BP: 103/69  Pulse: (!) 108  Resp: 16  Temp: 99.2 F (37.3 C)  TempSrc: Oral  SpO2: 97%    Appears well, in no apparent distress. Abdomen is soft without tenderness, guarding, mass, rebound or organomegaly. No CVA tenderness or inguinal adenopathy noted.  Labs Reviewed  POCT URINALYSIS DIP (DEVICE) - Abnormal; Notable for the following components:      Result Value   Hgb urine dipstick TRACE (*)    Protein, ur 30 (*)    Nitrite POSITIVE (*)    Leukocytes, UA SMALL (*)    All other components within normal limits  URINE CULTURE  POCT PREGNANCY, URINE  URINE CYTOLOGY ANCILLARY ONLY    No Known Allergies  Past Medical History:  Diagnosis  Date  . Lupus 2005   Social History   Socioeconomic History  . Marital status: Single    Spouse name: Not on file  . Number of children: Not on file  . Years of education: Not on file  . Highest education level: Not on file  Social Needs  . Financial resource strain: Not on file  . Food insecurity - worry: Not on file  . Food insecurity - inability: Not on file  . Transportation needs - medical: Not on file  . Transportation needs - non-medical: Not on file  Occupational History  . Not on file  Tobacco Use  . Smoking status: Never Smoker  . Smokeless tobacco: Never Used  Substance and Sexual Activity  . Alcohol use: Yes    Alcohol/week: 1.2 - 3.6 oz    Types: 2 - 6 Standard drinks or equivalent per week    Comment: weekends  . Drug use: No  . Sexual activity: Yes    Partners: Male    Birth control/protection: IUD    Comment: Skyla inserted 06/13/14  Other Topics Concern  . Not on file  Social History Narrative  . Not on file   Family History  Problem Relation Age of Onset  . Lupus Father   . Kidney failure Father        associated with Lupus  . Aneurysm Paternal Grandmother   . Alcohol abuse Paternal Grandmother   . Breast  cancer Maternal Grandmother   . Cancer Paternal Grandfather        unsure type       Vanessa Kick, MD 01/30/17 [phone]

## 2017-01-28 NOTE — ED Notes (Signed)
Clean and dirty urine placed in lab

## 2017-01-30 LAB — URINE CYTOLOGY ANCILLARY ONLY
Chlamydia: NEGATIVE
NEISSERIA GONORRHEA: NEGATIVE
TRICH (WINDOWPATH): NEGATIVE

## 2017-01-31 LAB — URINE CULTURE

## 2017-02-01 LAB — URINE CYTOLOGY ANCILLARY ONLY: CANDIDA VAGINITIS: NEGATIVE

## 2017-02-25 ENCOUNTER — Encounter (HOSPITAL_COMMUNITY): Payer: Self-pay | Admitting: Emergency Medicine

## 2017-02-25 ENCOUNTER — Ambulatory Visit (HOSPITAL_COMMUNITY)
Admission: EM | Admit: 2017-02-25 | Discharge: 2017-02-25 | Disposition: A | Discharge status: Home / Self Care | Payer: 59 | Attending: Family Medicine | Admitting: Family Medicine

## 2017-02-25 DIAGNOSIS — Z3202 Encounter for pregnancy test, result negative: Secondary | ICD-10-CM

## 2017-02-25 DIAGNOSIS — N3090 Cystitis, unspecified without hematuria: Secondary | ICD-10-CM | POA: Insufficient documentation

## 2017-02-25 DIAGNOSIS — Z113 Encounter for screening for infections with a predominantly sexual mode of transmission: Secondary | ICD-10-CM | POA: Diagnosis not present

## 2017-02-25 DIAGNOSIS — R300 Dysuria: Secondary | ICD-10-CM | POA: Diagnosis not present

## 2017-02-25 LAB — POCT URINALYSIS DIP (DEVICE)
BILIRUBIN URINE: NEGATIVE
Glucose, UA: NEGATIVE mg/dL
KETONES UR: NEGATIVE mg/dL
NITRITE: POSITIVE — AB
PROTEIN: NEGATIVE mg/dL
Specific Gravity, Urine: 1.025 (ref 1.005–1.030)
Urobilinogen, UA: 0.2 mg/dL (ref 0.0–1.0)
pH: 5.5 (ref 5.0–8.0)

## 2017-02-25 LAB — POCT PREGNANCY, URINE: Preg Test, Ur: NEGATIVE

## 2017-02-25 MED ORDER — CEPHALEXIN 500 MG PO CAPS: 500.0000 mg | ORAL_CAPSULE | Freq: Two times a day (BID) | ORAL | 0 refills | Status: DC

## 2017-02-25 NOTE — Discharge Instructions (Addendum)
We have sent testing for sexually transmitted infections in addition to a urine culture. We will notify you of any positive results once they are received. If required, we will prescribe any medications you might need.

## 2017-02-25 NOTE — ED Triage Notes (Signed)
Pt here for UTI sx and would like to be screened for STDs

## 2017-02-25 NOTE — ED Provider Notes (Signed)
[place]    ASSESSMENT & PLAN:  1. Cystitis   2. Screening for STDs (sexually transmitted diseases)     Meds ordered this encounter  Medications  . cephALEXin (KEFLEX) 500 MG capsule    Sig: Take 1 capsule (500 mg total) by mouth 2 (two) times daily.    Dispense:  10 capsule    Refill:  0    Urine culture sent. Will notify patient when results available. Will follow up with her PCP or here if not showing improvement over the next 48 hours, sooner if needed. Urine cytology sent. No empiric treatment.  Outlined signs and symptoms indicating need for more acute intervention. Patient verbalized understanding. After Visit Summary given.  SUBJECTIVE:  Samantha Davies is a 21 y.o. female who complains of urinary frequency, urgency and dysuria for the past few days. No flank pain, fever, chills, vaginal bleeding. Hematuria: not present.  Normal PO intake. No flank or abdominal pain. No self treatment.  LMP: No LMP recorded. Patient is not currently having periods (Reason: IUD).   Requests STD screening. No current symptoms. Sexually active with one female partner. Occasional condom use.  ROS: As in HPI.  OBJECTIVE:  Vitals:   02/25/17 1244  BP: 96/63  Pulse: 85  Resp: 18  Temp: 98.5 F (36.9 C)  TempSrc: Oral  SpO2: 100%   Appears well, in no apparent distress. Abdomen is soft without tenderness, guarding, mass, rebound or organomegaly. No CVA tenderness or inguinal adenopathy noted.  Labs Reviewed  POCT URINALYSIS DIP (DEVICE) - Abnormal; Notable for the following components:      Result Value   Hgb urine dipstick TRACE (*)    Nitrite POSITIVE (*)    Leukocytes, UA SMALL (*)    All other components within normal limits  URINE CULTURE  POCT PREGNANCY, URINE  URINE CYTOLOGY ANCILLARY ONLY    No Known Allergies  Past Medical History:  Diagnosis Date  . Lupus 2005   Social History   Socioeconomic History  . Marital status: Single    Spouse  name: Not on file  . Number of children: Not on file  . Years of education: Not on file  . Highest education level: Not on file  Social Needs  . Financial resource strain: Not on file  . Food insecurity - worry: Not on file  . Food insecurity - inability: Not on file  . Transportation needs - medical: Not on file  . Transportation needs - non-medical: Not on file  Occupational History  . Not on file  Tobacco Use  . Smoking status: Never Smoker  . Smokeless tobacco: Never Used  Substance and Sexual Activity  . Alcohol use: Yes    Alcohol/week: 1.2 - 3.6 oz    Types: 2 - 6 Standard drinks or equivalent per week    Comment: weekends  . Drug use: No  . Sexual activity: Yes    Partners: Male    Birth control/protection: IUD    Comment: Skyla inserted 06/13/14  Other Topics Concern  . Not on file  Social History Narrative  . Not on file   Family History  Problem Relation Age of Onset  . Lupus Father   . Kidney failure Father        associated with Lupus  . Aneurysm Paternal Grandmother   . Alcohol abuse Paternal Grandmother   . Breast cancer Maternal Grandmother   . Cancer Paternal Grandfather        unsure type  Vanessa Kick, MD 02/25/17 [phone]

## 2017-02-27 LAB — URINE CYTOLOGY ANCILLARY ONLY
CHLAMYDIA, DNA PROBE: NEGATIVE
NEISSERIA GONORRHEA: NEGATIVE
TRICH (WINDOWPATH): NEGATIVE

## 2017-02-27 LAB — URINE CULTURE: Culture: >=100000 — AB

## 2017-02-28 LAB — URINE CYTOLOGY ANCILLARY ONLY: CANDIDA VAGINITIS: NEGATIVE

## 2017-04-06 ENCOUNTER — Ambulatory Visit (HOSPITAL_COMMUNITY)
Admission: EM | Admit: 2017-04-06 | Discharge: 2017-04-06 | Disposition: A | Discharge status: Home / Self Care | Payer: 59 | Attending: Family Medicine | Admitting: Family Medicine

## 2017-04-06 ENCOUNTER — Encounter (HOSPITAL_COMMUNITY): Payer: Self-pay | Admitting: Emergency Medicine

## 2017-04-06 ENCOUNTER — Other Ambulatory Visit: Payer: Self-pay

## 2017-04-06 DIAGNOSIS — Z975 Presence of (intrauterine) contraceptive device: Secondary | ICD-10-CM

## 2017-04-06 DIAGNOSIS — Z113 Encounter for screening for infections with a predominantly sexual mode of transmission: Secondary | ICD-10-CM | POA: Diagnosis present

## 2017-04-06 NOTE — ED Notes (Signed)
Patient sent for a dirty urine

## 2017-04-06 NOTE — Discharge Instructions (Signed)
We have sent testing for sexually transmitted infections. We will notify you of any positive results once they are received. If required, we will prescribe any medications you might need. °

## 2017-04-06 NOTE — ED Provider Notes (Signed)
  [place]   [id number] 04/06/17 Arrival Time: 1779  ASSESSMENT & PLAN:  1. Screening for STDs (sexually transmitted diseases)    HIV/RPR/urine cytology sent. Will notify of any positive results.  May f/u as needed.  Reviewed expectations re: course of current medical issues. Questions answered. Outlined signs and symptoms indicating need for more acute intervention. Patient verbalized understanding. After Visit Summary given.   SUBJECTIVE:  Samantha Davies is a 22 y.o. female who presents requesting STD screening. No current symptoms. Afebrile. No abdominal or pelvic pain. No n/v. No rashes or lesions. Sexually active with single female partner with condom use. No h/o STD reported.  No LMP recorded. Patient is not currently having periods (Reason: IUD).  ROS: As per HPI.  OBJECTIVE:  Vitals:   04/06/17 1643 04/06/17 1644  BP: 109/80   Pulse: 68   Resp: 16   Temp: 98.5 F (36.9 C)   TempSrc: Oral   SpO2: 97%   Weight:  113 lb (51.3 kg)  Height:  5\' 1"  (1.549 m)    General appearance: alert, cooperative, appears stated age and no distress Throat: lips, mucosa, and tongue normal; teeth and gums normal Back: no CVA tenderness Abdomen: soft, non-tender; bowel sounds normal; no masses or organomegaly; no guarding or rebound tenderness Skin: warm and dry Psychological:  Alert and cooperative. Normal mood and affect.   Labs Reviewed  RPR  HIV ANTIBODY (ROUTINE TESTING)  URINE CYTOLOGY ANCILLARY ONLY   No Known Allergies  Past Medical History:  Diagnosis Date  . Lupus 2005   Family History  Problem Relation Age of Onset  . Lupus Father   . Kidney failure Father        associated with Lupus  . Aneurysm Paternal Grandmother   . Alcohol abuse Paternal Grandmother   . Breast cancer Maternal Grandmother   . Cancer Paternal Grandfather        unsure type   Social History   Socioeconomic History  . Marital status: Single    Spouse name: Not on  file  . Number of children: Not on file  . Years of education: Not on file  . Highest education level: Not on file  Social Needs  . Financial resource strain: Not on file  . Food insecurity - worry: Not on file  . Food insecurity - inability: Not on file  . Transportation needs - medical: Not on file  . Transportation needs - non-medical: Not on file  Occupational History  . Not on file  Tobacco Use  . Smoking status: Never Smoker  . Smokeless tobacco: Never Used  Substance and Sexual Activity  . Alcohol use: Yes    Alcohol/week: 1.2 - 3.6 oz    Types: 2 - 6 Standard drinks or equivalent per week    Comment: weekends  . Drug use: No  . Sexual activity: Yes    Partners: Male    Birth control/protection: IUD    Comment: Skyla inserted 06/13/14  Other Topics Concern  . Not on file  Social History Narrative  . Not on file          Vanessa Kick, MD 04/06/17 [phone]

## 2017-04-06 NOTE — ED Triage Notes (Signed)
Partner thinks he has an std.  Patient says they have used condoms.  Recently screened at [facility] on 02/25/2017.  States no std

## 2017-04-07 LAB — RPR: RPR Ser Ql: NONREACTIVE

## 2017-04-07 LAB — URINE CYTOLOGY ANCILLARY ONLY
CHLAMYDIA, DNA PROBE: NEGATIVE
Neisseria Gonorrhea: NEGATIVE
TRICH (WINDOWPATH): NEGATIVE

## 2017-04-07 LAB — HIV ANTIBODY (ROUTINE TESTING W REFLEX): HIV SCREEN 4TH GENERATION: NONREACTIVE

## 2017-04-10 LAB — URINE CYTOLOGY ANCILLARY ONLY: Candida vaginitis: NEGATIVE

## 2017-05-15 ENCOUNTER — Encounter: Payer: Self-pay | Admitting: Obstetrics and Gynecology

## 2017-05-15 ENCOUNTER — Other Ambulatory Visit: Payer: Self-pay

## 2017-05-15 ENCOUNTER — Ambulatory Visit (INDEPENDENT_AMBULATORY_CARE_PROVIDER_SITE_OTHER): Payer: 59 | Admitting: Obstetrics and Gynecology

## 2017-05-15 ENCOUNTER — Other Ambulatory Visit (HOSPITAL_COMMUNITY)
Admission: RE | Admit: 2017-05-15 | Discharge: 2017-05-15 | Disposition: A | Discharge status: Home / Self Care | Payer: 59 | Source: Ambulatory Visit | Attending: Obstetrics and Gynecology | Admitting: Obstetrics and Gynecology

## 2017-05-15 ENCOUNTER — Ambulatory Visit: Payer: BC Managed Care – PPO | Admitting: Nurse Practitioner

## 2017-05-15 VITALS — BP 98/60 | HR 84 | Resp 16 | Ht 62.25 in | Wt 103.0 lb

## 2017-05-15 DIAGNOSIS — Z01419 Encounter for gynecological examination (general) (routine) without abnormal findings: Secondary | ICD-10-CM | POA: Diagnosis not present

## 2017-05-15 DIAGNOSIS — Z7189 Other specified counseling: Secondary | ICD-10-CM

## 2017-05-15 DIAGNOSIS — B9689 Other specified bacterial agents as the cause of diseases classified elsewhere: Secondary | ICD-10-CM | POA: Insufficient documentation

## 2017-05-15 DIAGNOSIS — R350 Frequency of micturition: Secondary | ICD-10-CM

## 2017-05-15 DIAGNOSIS — Z113 Encounter for screening for infections with a predominantly sexual mode of transmission: Secondary | ICD-10-CM

## 2017-05-15 DIAGNOSIS — N870 Mild cervical dysplasia: Secondary | ICD-10-CM | POA: Insufficient documentation

## 2017-05-15 DIAGNOSIS — R634 Abnormal weight loss: Secondary | ICD-10-CM

## 2017-05-15 DIAGNOSIS — R631 Polydipsia: Secondary | ICD-10-CM | POA: Diagnosis not present

## 2017-05-15 DIAGNOSIS — Z3009 Encounter for other general counseling and advice on contraception: Secondary | ICD-10-CM

## 2017-05-15 DIAGNOSIS — Z23 Encounter for immunization: Secondary | ICD-10-CM

## 2017-05-15 DIAGNOSIS — Z7185 Encounter for immunization safety counseling: Secondary | ICD-10-CM

## 2017-05-15 LAB — POCT URINALYSIS DIPSTICK
BILIRUBIN UA: NEGATIVE
Blood, UA: NEGATIVE
Glucose, UA: NEGATIVE
Ketones, UA: NEGATIVE
Nitrite, UA: NEGATIVE
Protein, UA: NEGATIVE
Urobilinogen, UA: 0.2 E.U./dL
pH, UA: 5 (ref 5.0–8.0)

## 2017-05-15 NOTE — Patient Instructions (Signed)

## 2017-05-15 NOTE — Progress Notes (Signed)
22 y.o. G0P0000 Single African American female here for annual exam.  Patient complains of having symptoms of UTI. Urine shows trace of WBC.  Due for IUD exchange in April.  Has menses every month.   Recent visit to hospital on 04/06/17.  Had positive BV.   No tx because she did not have any symptoms. HIV, RPR, GC/CT, trichomonas negative. Wants repeat STD testing.  New partner since this testing.   Posiitive E Coli UTI on 02/25/17.  Tx with Keflex 500 mg po bid x 5 days.   ROS - urgency and frequency of urination.  Notes this onset after intercourse. Pain with urination which is slight toward the end of urination, starting a couple of days ago.  No nausea, vomiting, fever.  Some low back pain, chronic.   Weight loss. Planning to exercise more.  Weighed 113 last year.  States her thyroid was tested a couple of months ago. Excessive thirst.   Working and going to school.  Routine labs with PCP.  PCP: Dr. Dossie Der [facility].   Patient's last menstrual period was 04/14/2017.           Sexually active: Yes.    The current method of family planning is Skyla IUD inserted 06/13/14 and Condoms (sometimes).    Exercising: No.  The patient does not participate in regular exercise at present. Smoker:  no  Health Maintenance: Pap:  05/10/16 ASUCS (+)HR HPV History of abnormal Pap:  Yes, 04/10/15 ASCUS (+) HR HPV TDaP: 2013 Gardasil:   Completed only 1  HIV: 04/06/17 Negative Hep C: never Screening Labs:  [facility]   reports that  has never smoked. she has never used smokeless tobacco. She reports that she drinks about 1.2 - 3.6 oz of alcohol per week. She reports that she does not use drugs.  Past Medical History:  Diagnosis Date  . Anxiety   . Depression   . Lupus 2005    History reviewed. No pertinent surgical history.  Current Outpatient Medications  Medication Sig Dispense Refill  . belimumab (BENLYSTA) 120 MG SOLR injection Inject 10  mg/kg into the vein every 30 (thirty) days.    . Cholecalciferol (VITAMIN D-1000 MAX ST) 1000 units tablet Take by mouth daily.    . clobetasol (TEMOVATE) 0.05 % external solution Apply daily to scalp as needed for itching and flaking.    . hydrOXYzine (ATARAX/VISTARIL) 25 MG tablet Take 50 mg by mouth as needed.     . Levonorgestrel 13.5 MG IUD by Intrauterine route.    . Multiple Vitamins-Minerals (MULTIVITAMIN GUMMIES ADULT PO) Take by mouth daily.    . SUMAtriptan (IMITREX) 25 MG tablet Take 1 tab by mouth as needed. May repeat in 2 hrs if migraine persists. PATIENT NEEDS OFFICE VISIT FOR ADDITIONAL REFILLS 10 tablet 0   No current facility-administered medications for this visit.     Family History  Problem Relation Age of Onset  . Lupus Father   . Kidney failure Father        associated with Lupus  . Anxiety disorder Mother   . Mood Disorder Mother   . Aneurysm Paternal Grandmother   . Alcohol abuse Paternal Grandmother   . Breast cancer Maternal Grandmother   . Cancer Paternal Grandfather        unsure type    ROS:  Pertinent items are noted in HPI.  Otherwise, a comprehensive ROS was negative.  Exam:   BP 98/60 (BP Location: Right Arm, Patient  Position: Sitting, Cuff Size: Normal)   Pulse 84   Resp 16   Ht 5' 2.25" (1.581 m)   Wt 103 lb (46.7 kg)   LMP 04/14/2017   BMI 18.69 kg/m     General appearance: alert, cooperative and appears stated age Head: Normocephalic, without obvious abnormality, atraumatic Neck: no adenopathy, supple, symmetrical, trachea midline and thyroid normal to inspection and palpation Lungs: clear to auscultation bilaterally Breasts: normal appearance, no masses or tenderness, No nipple retraction or dimpling, No nipple discharge or bleeding, No axillary or supraclavicular adenopathy Heart: regular rate and rhythm Abdomen: soft, non-tender; no masses, no organomegaly Extremities: extremities normal, atraumatic, no cyanosis or edema Skin: Skin  color, texture, turgor normal. No rashes or lesions Lymph nodes: Cervical, supraclavicular, and axillary nodes normal. No abnormal inguinal nodes palpated Neurologic: Grossly normal  Pelvic: External genitalia:  no lesions              Urethra:  normal appearing urethra with no masses, tenderness or lesions              Bartholins and Skenes: normal                 Vagina: normal appearing vagina with normal color and discharge, no lesions              Cervix: no lesions.  IUD strings noted.               Pap taken: Yes.   Bimanual Exam:  Uterus:  normal size, contour, position, consistency, mobility, non-tender              Adnexa: no mass, fullness, tenderness           Chaperone was present for exam.  Assessment:   Well woman visit with normal exam. Urinary frequency and dysuria, mild. Skyla. Expiring soon.  ASCUS and positive HR HPV x 2.  Hx BV.  Hx UTI.   Plan: Mammogram screening discussed. Recommended self breast awareness. Pap taken.  If this pap is abnormal or positive HR HPV, will do colposcopy.  Guidelines for Calcium, Vitamin D, regular exercise program including cardiovascular and weight bearing exercise. Urine micro and culture.  No abx until after culture back.  HgbA1C and STD screening and BV.  Gardasil completion.  Plan for [employer] IUD with exchange.  Will plan for lidocaine 2% and Cytotec in preparation for the exchange. Follow up annually and prn.   After visit summary provided.

## 2017-05-16 LAB — HEPATITIS C ANTIBODY

## 2017-05-16 LAB — URINALYSIS, MICROSCOPIC ONLY: Casts: NONE SEEN /lpf

## 2017-05-16 LAB — HEP, RPR, HIV PANEL
HEP B S AG: NEGATIVE
HIV SCREEN 4TH GENERATION: NONREACTIVE
RPR Ser Ql: NONREACTIVE

## 2017-05-16 LAB — HEMOGLOBIN A1C
Est. average glucose Bld gHb Est-mCnc: 97 mg/dL
Hgb A1c MFr Bld: 5 % (ref 4.8–5.6)

## 2017-05-17 ENCOUNTER — Telehealth: Payer: Self-pay | Admitting: *Deleted

## 2017-05-17 ENCOUNTER — Other Ambulatory Visit: Payer: Self-pay | Admitting: *Deleted

## 2017-05-17 DIAGNOSIS — R87612 Low grade squamous intraepithelial lesion on cytologic smear of cervix (LGSIL): Secondary | ICD-10-CM

## 2017-05-17 LAB — CYTOLOGY - PAP
Bacterial vaginitis: POSITIVE — AB
Chlamydia: NEGATIVE
NEISSERIA GONORRHEA: NEGATIVE
TRICH (WINDOWPATH): NEGATIVE

## 2017-05-17 LAB — URINE CULTURE

## 2017-05-17 MED ORDER — METRONIDAZOLE 500 MG PO TABS: 500.0000 mg | ORAL_TABLET | Freq: Two times a day (BID) | ORAL | 0 refills | Status: DC

## 2017-05-17 MED ORDER — SULFAMETHOXAZOLE-TRIMETHOPRIM 800-160 MG PO TABS: 1.0000 | ORAL_TABLET | Freq: Two times a day (BID) | ORAL | 0 refills | Status: AC

## 2017-05-17 NOTE — Telephone Encounter (Signed)
Notes recorded by Burnice Logan, RN on 05/17/2017 at 5:50 PM EST Spoke with patient, advised as seen below per Dr. Quincy Simmonds. Rx for Bactrim and Flagyl to verified pharmacy on file. Brief explanation of colposcopy provided, questions answered. Advised will need to treat BV, wait 2 wks for colpo, patient request to schedule colpo on 3/26. IUD exchange cancelled for 3/26, colpo scheduled for 06/06/17 at 10am with Dr. Quincy Simmonds. Advised patient will schedule IUD exchange once colposcopy results return and are reviewed by Dr. Quincy Simmonds. Advised to take Motrin 800 mg with food and water one hour before procedure. Patient verbalizes understanding and is agreeable. See telephone encounter dated 05/17/17 to review with provider.   Order placed for colposcopy.    Routing to [employer] for [employer].  Cc: Dr. Quincy Simmonds

## 2017-05-17 NOTE — Telephone Encounter (Signed)
-----   Message from Nunzio Cobbs, MD sent at 05/17/2017  5:31 PM EST ----- Please contact regarding results. Her urine culture is showing E Coli. I recommend Bactrim DS po bid for 3 days.   Her pap is also showing bacterial vaginosis. She may treat with Flagyl 500 mg po bid for 7 days or Metrogel pv at hs for 5 nights.  Please send Rx to pharmacy of choice. ETOH precautions.   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   Her STD testing is negative for HIV, syphilis, hep B and C, trich, GC, and chlamydia.  Hemoglobin A1C is normal.

## 2017-05-25 ENCOUNTER — Telehealth: Payer: Self-pay | Admitting: Obstetrics and Gynecology

## 2017-05-25 MED ORDER — METRONIDAZOLE 0.75 % VA GEL: 1.0000 | Freq: Every day | VAGINAL | 0 refills | Status: DC

## 2017-05-25 NOTE — Telephone Encounter (Signed)
Patient left message on answering machine with questions about her medication.

## 2017-05-25 NOTE — Telephone Encounter (Signed)
Spoke with patient. Patient states that she has having a hard time keep down the Flagyl medication for BV. Each time she is unable to swallow the dosage. Asking for an alternative as she has not been able to complete a single dose. Advised can use Metrogel x 5 days. Patient is agreeable. Rx for Metrogel place 1 applicator vaginally nightly x 5 nights. Patient verbalizes understanding.  Routing to provider for final review. Patient agreeable to disposition. Will close encounter.

## 2017-05-29 NOTE — Progress Notes (Signed)
Subjective:     Patient ID: Samantha Davies, female   DOB: [birth year], 22 y.o.   MRN: [id number]  HPI  Patient here today for colposcopy. 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  Feels better after tx for UTI with Bactrim.  Tx BV with Flagyl initially and had difficulty swallowing it.  Was able to use the Metrogel.   Pap history: 05-15-17 LGSIL:CIN 1/HPV 05-10-16 ASCUS:Pos HR HPV 04-10-15 ASCUS:Pos HR HPV   Review of Systems  LMP: 04-21-17 Contraception: Skyla inserted 06-13-14 and condoms  UPT:Neg       Objective:   Physical Exam  Genitourinary:     Colposcopy  Consent obtained.  3% acetic acid used.  White light and green light filter used.  colpo satisfactory.  IUD strings noted.  Subtle flat acetowhite change at 4:00.  Metrogel noted in the vagina.  ECC taken and sent to path.  Biopsy at 4:00 taken and sent to path.  Monsel's placed.  No complications.  Minimal EBL.   Unable to do colpo of the vulva due to Metrogel.     Assessment:       LGSIL.   Plan:     FU biopsy results.  Will like proceed forward with IUD exchange to [facility] as tx is not anticipated.  Pap in one year planned at this time.

## 2017-05-31 ENCOUNTER — Telehealth: Payer: Self-pay | Admitting: Obstetrics and Gynecology

## 2017-05-31 NOTE — Telephone Encounter (Signed)
Returning a call to [place].

## 2017-06-06 ENCOUNTER — Other Ambulatory Visit: Payer: Self-pay

## 2017-06-06 ENCOUNTER — Encounter: Payer: Self-pay | Admitting: Obstetrics and Gynecology

## 2017-06-06 ENCOUNTER — Ambulatory Visit (INDEPENDENT_AMBULATORY_CARE_PROVIDER_SITE_OTHER): Payer: 59 | Admitting: Obstetrics and Gynecology

## 2017-06-06 VITALS — BP 90/64 | HR 70 | Ht 62.25 in | Wt 103.4 lb

## 2017-06-06 DIAGNOSIS — R87612 Low grade squamous intraepithelial lesion on cytologic smear of cervix (LGSIL): Secondary | ICD-10-CM

## 2017-06-06 DIAGNOSIS — Z01812 Encounter for preprocedural laboratory examination: Secondary | ICD-10-CM | POA: Diagnosis not present

## 2017-06-06 LAB — POCT URINE PREGNANCY: PREG TEST UR: NEGATIVE

## 2017-06-06 NOTE — Patient Instructions (Signed)

## 2017-06-08 ENCOUNTER — Other Ambulatory Visit: Payer: Self-pay | Admitting: Obstetrics and Gynecology

## 2017-06-12 HISTORY — PX: OTHER SURGICAL HISTORY: SHX169

## 2017-06-13 ENCOUNTER — Other Ambulatory Visit: Payer: Self-pay | Admitting: *Deleted

## 2017-06-13 MED ORDER — MISOPROSTOL 200 MCG PO TABS: ORAL_TABLET | ORAL | 0 refills | Status: DC

## 2017-06-14 NOTE — Progress Notes (Signed)
GYNECOLOGY  VISIT   HPI: 22 y.o.   Single  African American  female   [place] with Patient's last menstrual period was 05/23/2017.   here for [facility] IUD exchange.    Plan is for a [place] IUD.  Last IUD insertion was painful.  Colpo biopsy showed LGSIL.  UPT - negative.   GYNECOLOGIC HISTORY: Patient's last menstrual period was 05/23/2017. Contraception:  Skyla IUD 06-13-14/condoms Menopausal hormone therapy:  n/a Last mammogram:  n/a Last pap smear: 05-15-17 LGSIL, 05-10-16 ASCUS:Pos HR HPV        OB History    Gravida  0   Para  0   Term  0   Preterm  0   AB  0   Living  0     SAB  0   TAB  0   Ectopic  0   Multiple  0   Live Births                 Patient Active Problem List   Diagnosis Date Noted  . H/O urinary tract infection 09/09/2015  . 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 History of urinary tract infection 05/15/2012  . COMMON MIGRAINE 09/10/2006  . Systemic lupus erythematosus ([place]) 09/10/2006    Past Medical History:  Diagnosis Date  . Anxiety   . Depression   . Lupus ([place]) 2005    No past surgical history on file.  Current Outpatient Medications  Medication Sig Dispense Refill  . belimumab (BENLYSTA) 120 MG SOLR injection Inject 10 mg/kg into the vein every 30 (thirty) days.    . clobetasol (TEMOVATE) 0.05 % external solution Apply daily to scalp as needed for itching and flaking.    . Fluocinolone Acetonide Scalp 0.01 % OIL APP EXT AA BID FOR 14 DAYS  2  . Levonorgestrel 13.5 MG IUD by Intrauterine route.    . Multiple Vitamins-Minerals (MULTIVITAMIN GUMMIES ADULT PO) Take by mouth daily.    . SUMAtriptan (IMITREX) 25 MG tablet Take 1 tab by mouth as needed. May repeat in 2 hrs if migraine persists. PATIENT NEEDS OFFICE VISIT FOR ADDITIONAL REFILLS 10 tablet 0   No current facility-administered medications for this visit.      ALLERGIES: Flagyl [metronidazole]  Family History  Problem Relation  Age of Onset  . Lupus Father   . Kidney failure Father        associated with Lupus  . Anxiety disorder Mother   . Mood Disorder Mother   . Aneurysm Paternal Grandmother   . Alcohol abuse Paternal Grandmother   . Breast cancer Maternal Grandmother   . Cancer Paternal Grandfather        unsure type    Social History   Socioeconomic History  . Marital status: Single    Spouse name: Not on file  . Number of children: Not on file  . Years of education: Not on file  . Highest education level: Not on file  Occupational History  . Not on file  Social Needs  . Financial resource strain: Not on file  . Food insecurity:    Worry: Not on file    Inability: Not on file  . Transportation needs:    Medical: Not on file    Non-medical: Not on file  Tobacco Use  . Smoking status: Never Smoker  . Smokeless tobacco: Never Used  Substance and Sexual Activity  . Alcohol use: Yes    Alcohol/week: 1.2 - 3.6 oz  Types: 2 - 6 Standard drinks or equivalent per week    Comment: weekends  . Drug use: No  . Sexual activity: Yes    Partners: Male    Birth control/protection: IUD, Condom    Comment: Skyla inserted 06/13/14  Lifestyle  . Physical activity:    Days per week: Not on file    Minutes per session: Not on file  . Stress: Not on file  Relationships  . Social connections:    Talks on phone: Not on file    Gets together: Not on file    Attends religious service: Not on file    Active member of club or organization: Not on file    Attends meetings of clubs or organizations: Not on file    Relationship status: Not on file  . Intimate partner violence:    Fear of current or ex partner: Not on file    Emotionally abused: Not on file    Physically abused: Not on file    Forced sexual activity: Not on file  Other Topics Concern  . Not on file  Social History Narrative  . Not on file    ROS:  Pertinent items are noted in HPI.  PHYSICAL EXAMINATION:    BP 98/60   Pulse 70    Ht 5' 2.25" (1.581 m)   Wt 103 lb (46.7 kg)   LMP 05/23/2017   BMI 18.69 kg/m     General appearance: alert, cooperative and appears stated age  Pelvic: External genitalia:  no lesions              Urethra:  normal appearing urethra with no masses, tenderness or lesions              Bartholins and Skenes: normal                 Vagina: normal appearing vagina with normal color and discharge, no lesions              Cervix: no lesions.  IUD strings noted.                Bimanual Exam:  Uterus:  normal size, contour, position, consistency, mobility, non-tender              Adnexa: no mass, fullness, tenderness        IUD removal and reinsertion of new IUD. Consent for procedures. Speculum placed.  IUD strings noted. Skyla IUD removed with ring forceps, without difficulty, intact, shown to patient, and discarded. Sterile prep with Hibiclens. Paracervical block with 2% lidocaine, 10 cc, lot number [id number], exp 2/22. Tenaculum to anterior cervical lip. Uterus sounded to 7 cm. Kyleena IUD insertion.  Lot number [id number], exp Feb 2021.  Strings trimmed.  Minimal EBL.  No complications.   Repeat pelvic exam no change.   Chaperone was present for exam.  ASSESSMENT  Skyla IUD removal.  Kyleena IUD insertion.   PLAN  Instructions/precautions given.  Back up protection for 1 week. Motrin 800 mg po x 1 now.  IUD card to patient.  FU in one month.    An After Visit Summary was printed and given to the patient.

## 2017-06-19 ENCOUNTER — Ambulatory Visit (INDEPENDENT_AMBULATORY_CARE_PROVIDER_SITE_OTHER): Payer: 59 | Admitting: Obstetrics and Gynecology

## 2017-06-19 ENCOUNTER — Other Ambulatory Visit: Payer: Self-pay

## 2017-06-19 ENCOUNTER — Encounter: Payer: Self-pay | Admitting: Obstetrics and Gynecology

## 2017-06-19 VITALS — BP 98/60 | HR 70 | Ht 62.25 in | Wt 103.0 lb

## 2017-06-19 DIAGNOSIS — Z01812 Encounter for preprocedural laboratory examination: Secondary | ICD-10-CM | POA: Diagnosis not present

## 2017-06-19 DIAGNOSIS — Z30433 Encounter for removal and reinsertion of intrauterine contraceptive device: Secondary | ICD-10-CM | POA: Diagnosis not present

## 2017-06-19 DIAGNOSIS — Z3009 Encounter for other general counseling and advice on contraception: Secondary | ICD-10-CM

## 2017-06-19 LAB — POCT URINE PREGNANCY: Preg Test, Ur: NEGATIVE

## 2017-06-19 NOTE — Patient Instructions (Signed)

## 2017-06-20 ENCOUNTER — Encounter: Payer: Self-pay | Admitting: Obstetrics and Gynecology

## 2017-06-20 ENCOUNTER — Ambulatory Visit (INDEPENDENT_AMBULATORY_CARE_PROVIDER_SITE_OTHER): Payer: 59

## 2017-06-20 ENCOUNTER — Ambulatory Visit (INDEPENDENT_AMBULATORY_CARE_PROVIDER_SITE_OTHER): Payer: 59 | Admitting: Obstetrics and Gynecology

## 2017-06-20 ENCOUNTER — Telehealth: Payer: Self-pay | Admitting: Obstetrics and Gynecology

## 2017-06-20 ENCOUNTER — Ambulatory Visit: Payer: 59 | Admitting: Obstetrics & Gynecology

## 2017-06-20 VITALS — BP 102/56 | HR 104 | Temp 98.8°F | Resp 16 | Ht 62.25 in | Wt 103.0 lb

## 2017-06-20 DIAGNOSIS — R102 Pelvic and perineal pain: Secondary | ICD-10-CM

## 2017-06-20 DIAGNOSIS — R350 Frequency of micturition: Secondary | ICD-10-CM | POA: Diagnosis not present

## 2017-06-20 DIAGNOSIS — N9489 Other specified conditions associated with female genital organs and menstrual cycle: Secondary | ICD-10-CM

## 2017-06-20 LAB — POCT URINALYSIS DIPSTICK
BILIRUBIN UA: NEGATIVE
GLUCOSE UA: NEGATIVE
KETONES UA: NEGATIVE
Nitrite, UA: POSITIVE
Urobilinogen, UA: 0.2 E.U./dL
pH, UA: 5 (ref 5.0–8.0)

## 2017-06-20 MED ORDER — CIPROFLOXACIN HCL 500 MG PO TABS: 500.0000 mg | ORAL_TABLET | Freq: Two times a day (BID) | ORAL | 0 refills | Status: DC

## 2017-06-20 NOTE — Telephone Encounter (Signed)
Patient had iud placed yesterday and is experiencing very bad cramps today.

## 2017-06-20 NOTE — Patient Instructions (Signed)

## 2017-06-20 NOTE — Telephone Encounter (Signed)
Spoke with patient. Patient had a Kyleena IUD inserted yesterday. Reports insertion went well. States that she woke up today with increased uterine cramping. Reports since Sunday she has been having urinary frequency and odor. Woke up today with lower back pain. Now has chills. Denies fever, nausea , vomiting, or pain with urination. Took 800 mg of Advil just now. Advised will need to be seen for further evaluation. Advised will review with MD and return call.

## 2017-06-20 NOTE — Telephone Encounter (Signed)
Reviewed with Dr.Miller. Appointment scheduled for today at 4 pm. Patient is agreeable to date and time. Aware this is a work in appointment.  Cc: Dr.Silva  Routing to provider for final review. Patient agreeable to disposition. Will close encounter.

## 2017-06-20 NOTE — Progress Notes (Signed)
GYNECOLOGY  VISIT   HPI: 22 y.o.   Single  African American  female   Samantha Davies with Patient's last menstrual period was 05/23/2017.   here for ultrasound for pelvic cramping post IUD exchange yesterday.  Received Kyleena IUD.  Did tx for BV prior to IUD exchange.  Lower back and right sided pain. Pain started the night prior to IUD placement.  Uncomfortable urination and odor.  Last UTI was one month ago. E Coli UTI and was treated with Bactrim DS po bid x 3 days.  Urine dip showing 2+ WBC, 3+ RBC, nitrites.  GYNECOLOGIC HISTORY: Patient's last menstrual period was 05/23/2017. Contraception:  IUD inserted 06/19/17 Menopausal hormone therapy:  n/a Last mammogram:  n/a Last pap smear:   05-15-17 LGSIL, 05-10-16 ASCUS:Pos HR HPV        OB History    Gravida  0   Para  0   Term  0   Preterm  0   AB  0   Living  0     SAB  0   TAB  0   Ectopic  0   Multiple  0   Live Births                 Patient Active Problem List   Diagnosis Date Noted  . H/O urinary tract infection 09/09/2015  . 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 History of urinary tract infection 05/15/2012  . COMMON MIGRAINE 09/10/2006  . Systemic lupus erythematosus ([place]) 09/10/2006    Past Medical History:  Diagnosis Date  . Anxiety   . Depression   . Lupus ([place]) 2005    History reviewed. No pertinent surgical history.  Current Outpatient Medications  Medication Sig Dispense Refill  . belimumab (BENLYSTA) 120 MG SOLR injection Inject 10 mg/kg into the vein every 30 (thirty) days.    . clobetasol (TEMOVATE) 0.05 % external solution Apply daily to scalp as needed for itching and flaking.    . Fluocinolone Acetonide Scalp 0.01 % OIL APP EXT AA BID FOR 14 DAYS  2  . Levonorgestrel 13.5 MG IUD by Intrauterine route.    . Multiple Vitamins-Minerals (MULTIVITAMIN GUMMIES ADULT PO) Take by mouth daily.    . SUMAtriptan (IMITREX) 25 MG tablet Take 1 tab by mouth as  needed. May repeat in 2 hrs if migraine persists. PATIENT NEEDS OFFICE VISIT FOR ADDITIONAL REFILLS 10 tablet 0   No current facility-administered medications for this visit.      ALLERGIES: Flagyl [metronidazole]  Family History  Problem Relation Age of Onset  . Lupus Father   . Kidney failure Father        associated with Lupus  . Anxiety disorder Mother   . Mood Disorder Mother   . Aneurysm Paternal Grandmother   . Alcohol abuse Paternal Grandmother   . Breast cancer Maternal Grandmother   . Cancer Paternal Grandfather        unsure type    Social History   Socioeconomic History  . Marital status: Single    Spouse name: Not on file  . Number of children: Not on file  . Years of education: Not on file  . Highest education level: Not on file  Occupational History  . Not on file  Social Needs  . Financial resource strain: Not on file  . Food insecurity:    Worry: Not on file    Inability: Not on file  . Transportation needs:  Medical: Not on file    Non-medical: Not on file  Tobacco Use  . Smoking status: Never Smoker  . Smokeless tobacco: Never Used  Substance and Sexual Activity  . Alcohol use: Yes    Alcohol/week: 1.2 - 3.6 oz    Types: 2 - 6 Standard drinks or equivalent per week    Comment: weekends  . Drug use: No  . Sexual activity: Yes    Partners: Male    Birth control/protection: IUD, Condom    Comment: Skyla inserted 06/13/14  Lifestyle  . Physical activity:    Days per week: Not on file    Minutes per session: Not on file  . Stress: Not on file  Relationships  . Social connections:    Talks on phone: Not on file    Gets together: Not on file    Attends religious service: Not on file    Active member of club or organization: Not on file    Attends meetings of clubs or organizations: Not on file    Relationship status: Not on file  . Intimate partner violence:    Fear of current or ex partner: Not on file    Emotionally abused: Not on file     Physically abused: Not on file    Forced sexual activity: Not on file  Other Topics Concern  . Not on file  Social History Narrative  . Not on file    ROS:  Pertinent items are noted in HPI.  PHYSICAL EXAMINATION:    BP (!) 102/56 (BP Location: Left Arm, Patient Position: Sitting, Cuff Size: Normal)   Pulse (!) 104   Temp 98.8 F (37.1 C) (Oral)   Resp 16   Ht 5' 2.25" (1.581 m)   Wt 103 lb (46.7 kg)   LMP 05/23/2017   BMI 18.69 kg/m     General appearance: alert, cooperative and appears stated age    Pelvic: External genitalia:  no lesions              Urethra:  normal appearing urethra with no masses, tenderness or lesions              Bartholins and Skenes: normal                 Vagina: normal appearing vagina with normal color and discharge, no lesions              Cervix: no lesions.  Small amount of blood in the vagina.  IUD strings noted.                 Bimanual Exam:  Uterus:  normal size, contour, position, consistency, mobility, non-tender              Adnexa: no mass, fullness, tenderness              Pelvic US IUD in endometrial canal. Normal ovaries.  Chaperone was present for exam.  ASSESSMENT  Status post Skyla IUD removal and placement of Kyleena IUD.  Doubt endometritis. Uterine cramping.  UTI.  Recent E Coli UTI.  PLAN  Reassurance regarding IUD position and pelvic anatomy.  Ibuprofen 600 mg po q 6 hours prn or 800 mg po q 8 hours prn. Urine micro and culture.  Ciprofloxacin 500 mg po bid x 7 days.  Return if symptoms not improved in 48 hours.    An After Visit Summary was printed and given to the patient. __15___ minutes face to face time  of which over 50% was spent in counseling.

## 2017-06-20 NOTE — Addendum Note (Signed)
Addended by: Gwendlyn Deutscher on: 06/20/2017 04:18 PM   Modules accepted: Orders

## 2017-06-20 NOTE — Progress Notes (Signed)
Encounter reviewed by Dr. Kaydense Rizo Amundson C. Silva.  

## 2017-06-21 ENCOUNTER — Telehealth: Payer: Self-pay | Admitting: Obstetrics and Gynecology

## 2017-06-21 LAB — URINALYSIS, MICROSCOPIC ONLY
CASTS: NONE SEEN /LPF
EPITHELIAL CELLS (NON RENAL): NONE SEEN /HPF (ref 0–10)

## 2017-06-21 NOTE — Telephone Encounter (Signed)
She may take AZO standard 200 mg po tid prn bladder pain for the next 2 days.

## 2017-06-21 NOTE — Telephone Encounter (Signed)
Spoke with patient. Patient states she is having discomfort when she uses the restroom and when her bladder gets full. Asking for medication to help with UTI symptoms in addition to antibiotic. Advised may take OTC AZO Standard. Patient verbalizes understanding. Aware we are awaiting her urine culture results ans she will be contacted as soon as these return. Patient is agreeable.  Routing to [place] for final review.

## 2017-06-21 NOTE — Telephone Encounter (Signed)
Left message to call Glendi Mohiuddin at [phone]. 

## 2017-06-21 NOTE — Telephone Encounter (Signed)
Patient left a message at 12:36pm returning call from [place].

## 2017-06-21 NOTE — Telephone Encounter (Signed)
Patient called about excruciating pain from bladder infection. Wanting to know if there is a pain medication she can take. Advil is not helping.

## 2017-06-21 NOTE — Telephone Encounter (Signed)
Left message to call Kaitlyn at [phone]. 

## 2017-06-21 NOTE — Telephone Encounter (Signed)
Spoke with patient. Advised of message as seen below from Dr.Silva. Patient verbalizes understanding. Encounter closed. 

## 2017-06-22 LAB — URINE CULTURE

## 2017-07-12 ENCOUNTER — Encounter: Payer: Self-pay | Admitting: Obstetrics and Gynecology

## 2017-07-12 ENCOUNTER — Ambulatory Visit (INDEPENDENT_AMBULATORY_CARE_PROVIDER_SITE_OTHER): Payer: 59 | Admitting: Obstetrics and Gynecology

## 2017-07-12 ENCOUNTER — Other Ambulatory Visit: Payer: Self-pay

## 2017-07-12 VITALS — BP 100/70 | HR 84 | Resp 14 | Ht 62.25 in | Wt 101.0 lb

## 2017-07-12 DIAGNOSIS — N941 Unspecified dyspareunia: Secondary | ICD-10-CM | POA: Diagnosis not present

## 2017-07-12 DIAGNOSIS — Z30431 Encounter for routine checking of intrauterine contraceptive device: Secondary | ICD-10-CM

## 2017-07-12 NOTE — Patient Instructions (Addendum)
Call if you have continued pain with intercourse.    Dyspareunia, Female Dyspareunia is pain that is associated with sexual activity. This can affect any part of the genitals or lower abdomen, and there are many possible causes. This condition ranges from mild to severe. Depending on the cause, dyspareunia may get better with treatment, or it may return (recur) over time. What are the causes? The cause of this condition is not always known. Possible causes include:  Cancer.  Psychological factors, such as depression, anxiety, or previous traumatic experiences.  Severe pain and tenderness of the skin around the vagina (vulva) when it is touched (vulvar vestibulitis syndrome).  Infection of the pelvis or the vulva.  Infection of the vagina.  Painful, involuntary tightening (contraction) of the vaginal muscles when anything is put inside the vagina (vaginismus).  Allergic reaction.  Ovarian cysts.  Solid growths of tissue (tumors) in the ovaries or the uterus.  Scar tissue in the ovaries, vagina, or pelvis.  Vaginal dryness.  Thinning of the tissue (atrophy) of the vulva and vagina.  Skin conditions that affect the vulva (vulvar dermatoses), such as lichen sclerosus or lichen planus.  Endometriosis.  Tubal pregnancy.  A tilted uterus.  Uterine prolapse.  Adhesions in the vagina.  Bladder problems.  Intestinal problems.  Certain medicines.  Medical conditions such as diabetes, arthritis, or thyroid disease.  What increases the risk? The following factors may make you more likely to develop this condition:  Having experienced physical or sexual trauma.  Having given birth more than once.  Taking birth control pills.  Having gone through menopause.  Having recently given birth, typically within the past 3-6 months.  Breastfeeding.  What are the signs or symptoms? The main symptom of this condition is pain in any part of the genitals or lower abdomen  during or after sexual activity. This may include pain during sexual arousal, genital stimulation, or orgasm. Pain may get worse when anything is inserted into the vagina, or when the genitals are touched in any way, such as when sitting or wearing pants. Pain can range from mild to severe, depending on the cause of the condition. In some cases, symptoms go away with treatment and return (recur) at a later date. How is this diagnosed? This condition may be diagnosed based on:  Your symptoms, including: ? Where your pain is located. ? When your pain occurs.  Your medical history.  A physical exam. This may include a pelvic exam and a Pap test. This is a screening test that is used to check for signs of cancer of the vagina, cervix, and uterus.  Tests, including: ? Blood tests. ? Ultrasound. This uses sound waves to make a picture of the area that is being tested. ? Urine culture. This test involves checking a urine sample for signs of infection. ? Culture test. This is when your health care provider uses a swab to collect a sample of vaginal fluid. The sample is checked for signs of infection. ? X-rays. ? MRI. ? CT scan. ? Laparoscopy. This is a procedure in which a small incision is made in your lower abdomen and a lighted, pencil-sized instrument (laparoscope) is passed through the incision and used to look inside your pelvis.  You may be referred to a health care provider who specializes in women's health (gynecologist). In some cases, diagnosing the cause of dyspareunia can be difficult. How is this treated? Treatment depends on the cause of your condition and your symptoms. In most  cases, you may need to stop sexual activity until your symptoms improve. Treatment may include:  Lubricants.  Kegel exercises or vaginal dilators.  Medicated skin creams.  Medicated vaginal creams.  Hormonal therapy.  Antibiotic medicine to prevent or fight infection.  Medicines that help to  relieve pain.  Medicines that treat depression (antidepressants).  Psychological counseling.  Sex therapy.  Surgery.  Follow these instructions at home: Lifestyle  Avoid tight clothing and irritating materials around your genital and abdominal area.  Use water-based lubricants as needed. Avoid oil-based lubricants.  Do not use any products that irritate you. This may include certain condoms, spermicides, lubricants, soaps, tampons, vaginal sprays, or douches.  Always practice safe sex. Talk with your health care provider about which form of birth control (contraception) is best for you.  Maintain open communication with your sexual partner. General instructions  Take over-the-counter and prescription medicines only as told by your health care provider.  If you had tests done, it is your responsibility to get your tests results. Ask your health care provider or the department performing the test when your results will be ready.  Urinate before you engage in sexual activity.  Consider joining a support group.  Keep all follow-up visits as told by your health care provider. This is important. Contact a health care provider if:  You develop vaginal bleeding after sexual intercourse.  You develop a lump at the opening of your vagina. Seek medical care even if the lump is painless.  You have: ? Abnormal vaginal discharge. ? Vaginal dryness. ? Itchiness or irritation of your vulva or vagina. ? A new rash. ? Symptoms that get worse or do not improve with treatment. ? A fever. ? Pain when you urinate. ? Blood in your urine. Get help right away if:  You develop severe pain in your abdomen during or shortly after sexual intercourse.  You pass out after having sexual intercourse. This information is not intended to replace advice given to you by your health care provider. Make sure you discuss any questions you have with your health care provider. Document Released:  03/20/2007 Document Revised: 07/10/2015 Document Reviewed: 09/30/2014 Elsevier Interactive Patient Education  [employer].

## 2017-07-12 NOTE — Progress Notes (Signed)
GYNECOLOGY  VISIT   HPI: 22 y.o.   Single  African American  female   [id number] with No LMP recorded (lmp unknown). (Menstrual status: IUD).   here for IUD recheck.  Has [place] IUD. Doing well.   Discomfort with deep penetration with sex for the last 2 times. Patient had discomfort and not her partner.  Same partner since February.  Had negative GC/CT with pap in March 2019.  This pain does resolve soon after sex.   No pain otherwise.  Not bleeding at all.   Did have a pelvic US the day after IUD insertion and this confirmed proper placement.   Just moved to [place].  Living half way in between mother and father.  Feels less stressed now that she has moved and is settling in.  Separated from her ex and this has helped.   PCP - [facility] off [address].   GYNECOLOGIC HISTORY: No LMP recorded (lmp unknown). (Menstrual status: IUD). Contraception: Kyleena IUD inserted 06/19/17 Menopausal hormone therapy:  n/a Last mammogram:  n/a Last pap smear:  06/06/17 Colpo showing LGSIL; 05-15-17 LGSIL, 05-10-16 ASCUS:Pos HR HPV        OB History    Gravida  0   Para  0   Term  0   Preterm  0   AB  0   Living  0     SAB  0   TAB  0   Ectopic  0   Multiple  0   Live Births                 Patient Active Problem List   Diagnosis Date Noted  . H/O urinary tract infection 09/09/2015  . 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 History of urinary tract infection 05/15/2012  . COMMON MIGRAINE 09/10/2006  . Systemic lupus erythematosus ([place]) 09/10/2006    Past Medical History:  Diagnosis Date  . Anxiety   . Depression   . Lupus ([place]) 2005    History reviewed. No pertinent surgical history.  Current Outpatient Medications  Medication Sig Dispense Refill  . belimumab (BENLYSTA) 120 MG SOLR injection Inject 10 mg/kg into the vein every 30 (thirty) days.    . clobetasol (TEMOVATE) 0.05 % external solution Apply daily to scalp as  needed for itching and flaking.    . Fluocinolone Acetonide Scalp 0.01 % OIL APP EXT AA BID FOR 14 DAYS  2  . Levonorgestrel (KYLEENA) 19.5 MG IUD by Intrauterine route.    . Multiple Vitamins-Minerals (MULTIVITAMIN GUMMIES ADULT PO) Take by mouth daily.    . SUMAtriptan (IMITREX) 25 MG tablet Take 1 tab by mouth as needed. May repeat in 2 hrs if migraine persists. PATIENT NEEDS OFFICE VISIT FOR ADDITIONAL REFILLS 10 tablet 0   No current facility-administered medications for this visit.      ALLERGIES: Flagyl [metronidazole]  Family History  Problem Relation Age of Onset  . Lupus Father   . Kidney failure Father        associated with Lupus  . Anxiety disorder Mother   . Mood Disorder Mother   . Aneurysm Paternal Grandmother   . Alcohol abuse Paternal Grandmother   . Breast cancer Maternal Grandmother   . Cancer Paternal Grandfather        unsure type    Social History   Socioeconomic History  . Marital status: Single    Spouse name: Not on file  . Number of children: Not on  file  . Years of education: Not on file  . Highest education level: Not on file  Occupational History  . Not on file  Social Needs  . Financial resource strain: Not on file  . Food insecurity:    Worry: Not on file    Inability: Not on file  . Transportation needs:    Medical: Not on file    Non-medical: Not on file  Tobacco Use  . Smoking status: Never Smoker  . Smokeless tobacco: Never Used  Substance and Sexual Activity  . Alcohol use: Yes    Alcohol/week: 1.2 - 3.6 oz    Types: 2 - 6 Standard drinks or equivalent per week    Comment: weekends  . Drug use: No  . Sexual activity: Yes    Partners: Male    Birth control/protection: IUD, Condom    Comment: Kyleena inserted 06/19/17  Lifestyle  . Physical activity:    Days per week: Not on file    Minutes per session: Not on file  . Stress: Not on file  Relationships  . Social connections:    Talks on phone: Not on file    Gets  together: Not on file    Attends religious service: Not on file    Active member of club or organization: Not on file    Attends meetings of clubs or organizations: Not on file    Relationship status: Not on file  . Intimate partner violence:    Fear of current or ex partner: Not on file    Emotionally abused: Not on file    Physically abused: Not on file    Forced sexual activity: Not on file  Other Topics Concern  . Not on file  Social History Narrative  . Not on file    ROS:  Pertinent items are noted in HPI.  PHYSICAL EXAMINATION:    BP 100/70 (BP Location: Right Arm, Patient Position: Sitting, Cuff Size: Normal)   Pulse 84   Resp 14   Ht 5' 2.25" (1.581 m)   Wt 101 lb (45.8 kg)   LMP  (LMP Unknown)   BMI 18.33 kg/m     General appearance: alert, cooperative and appears stated age   Pelvic: External genitalia:  no lesions              Urethra:  normal appearing urethra with no masses, tenderness or lesions              Bartholins and Skenes: normal                 Vagina: normal appearing vagina with normal color and discharge, no lesions              Cervix: no lesions.  IUD strings noted.                 Bimanual Exam:  Uterus:  normal size, contour, position, consistency, mobility, non-tender              Adnexa: no mass, fullness, tenderness           Chaperone was present for exam.  ASSESSMENT  IUD check up.  Dyspareunia.  Life transition and situational stress.  PLAN  Reassured regarding IUD position.  Try a lubircant.  Call for continued pain.    An After Visit Summary was printed and given to the patient.  ___15___ minutes face to face time of which over 50% was spent in counseling.

## 2017-09-18 ENCOUNTER — Ambulatory Visit: Payer: 59

## 2017-09-18 NOTE — Progress Notes (Deleted)
Patient in today for 2nd Gardasil injection.   Contraception: IUD LMP:  NA Last AEX: 05/15/17 with 06/11/18  Injection given in ***. Patient tolerated shot well.   Patient informed next injection due in about 4 months. 01/19/18  Advised patient, if not on birth control, to return for next injection with cycle.   Routed to provider for final review.  Encounter closed.

## 2017-09-20 ENCOUNTER — Ambulatory Visit: Payer: 59

## 2017-09-20 NOTE — Progress Notes (Deleted)
Patient in today for her 2nd Gardasil injection.   Contraception: IUD LMP: *** Last AEX: 05/15/17 with Dr. Horald Pollen  Injection given in ***. Patient tolerated shot well.   Patient informed next injection due in about 4 months.  Advised patient, if not on birth control, to return for next injection with cycle.   Routed to provider for final review.  Encounter closed.

## 2017-09-21 ENCOUNTER — Ambulatory Visit (INDEPENDENT_AMBULATORY_CARE_PROVIDER_SITE_OTHER): Payer: 59 | Admitting: *Deleted

## 2017-09-21 ENCOUNTER — Other Ambulatory Visit: Payer: Self-pay

## 2017-09-21 VITALS — BP 100/64 | HR 74 | Resp 14 | Ht 60.75 in | Wt 105.5 lb

## 2017-09-21 DIAGNOSIS — N644 Mastodynia: Secondary | ICD-10-CM | POA: Diagnosis not present

## 2017-09-21 DIAGNOSIS — Z23 Encounter for immunization: Secondary | ICD-10-CM | POA: Diagnosis not present

## 2017-09-21 LAB — POCT URINE PREGNANCY: PREG TEST UR: NEGATIVE

## 2017-09-21 NOTE — Progress Notes (Signed)
Patient in today for second Gardasil injection.   Contraception: kyleena IUD  LMP: IUD Last AEX: 05-15-17 with Dr. Quincy Simmonds  Injection given in right deltoid. Patient tolerated shot well.   Patient informed next injection due in about 4 months (after 01-22-18).  Patient voiced concerns about breast tenderness and growth since IUD placement in April. After review with Dr. Quincy Simmonds, patient will provide sample for UPT. UPT negative. Patient will also schedule OV with Dr. Quincy Simmonds for another day. No contraindications to receive gardasil today per Dr. Quincy Simmonds.    Advised patient, if not on birth control, to return for next injection with cycle.   Routed to provider for final review.  Encounter closed.

## 2017-09-25 ENCOUNTER — Other Ambulatory Visit: Payer: Self-pay

## 2017-09-25 ENCOUNTER — Encounter: Payer: Self-pay | Admitting: Obstetrics and Gynecology

## 2017-09-25 ENCOUNTER — Ambulatory Visit (INDEPENDENT_AMBULATORY_CARE_PROVIDER_SITE_OTHER): Payer: 59 | Admitting: Obstetrics and Gynecology

## 2017-09-25 VITALS — BP 100/58 | HR 80 | Resp 16 | Ht 60.75 in | Wt 108.0 lb

## 2017-09-25 DIAGNOSIS — N941 Unspecified dyspareunia: Secondary | ICD-10-CM

## 2017-09-25 DIAGNOSIS — N644 Mastodynia: Secondary | ICD-10-CM | POA: Diagnosis not present

## 2017-09-25 NOTE — Progress Notes (Signed)
GYNECOLOGY  VISIT   HPI: 22 y.o.   Single  African American  female   [place] with Patient's last menstrual period was 07/12/2017 (within weeks).   here for breast pain and increase in breast size for 2 months.     Gaining weight.  Weighed about 100 pounds prior to her IUD this spring.   Having pain with intercourse.  Position changes help.  Same partner.  No discharge, pain or burning.  Hx of BV.   Pelvic US on 06/20/17 - IUD in proper position.   No pelvic pain if not having intercourse.   Doing Gardasil series.   Received second injection on 09/21/17.   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   Negative UPT last week on 09/21/17.   GYNECOLOGIC HISTORY: Patient's last menstrual period was 07/12/2017 (within weeks). Contraception:  [place] IUD inserted 06/19/17 Menopausal hormone therapy:  n/a Last mammogram:  n/a Last pap smear:   06/06/17 Colpo showing LGSIL; 05-15-17 LGSIL, 05-10-16 ASCUS:Pos HR HPV        OB History    Gravida  0   Para  0   Term  0   Preterm  0   AB  0   Living  0     SAB  0   TAB  0   Ectopic  0   Multiple  0   Live Births                 Patient Active Problem List   Diagnosis Date Noted  . H/O urinary tract infection 09/09/2015  . 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 History of urinary tract infection 05/15/2012  . COMMON MIGRAINE 09/10/2006  . Systemic lupus erythematosus ([place]) 09/10/2006    Past Medical History:  Diagnosis Date  . Anxiety   . Depression   . Lupus ([place]) 2005    History reviewed. No pertinent surgical history.  Current Outpatient Medications  Medication Sig Dispense Refill  . belimumab (BENLYSTA) 120 MG SOLR injection Inject 10 mg/kg into the vein every 30 (thirty) days.    . ciprofloxacin (CIPRO) 500 MG tablet TK 1 T PO BID FOR 7 DAYS  0  . Fluocinolone Acetonide Scalp 0.01 % OIL APP EXT AA BID FOR 14 DAYS  2  . Levonorgestrel (KYLEENA) 19.5 MG IUD by  Intrauterine route.    . Multiple Vitamins-Minerals (MULTIVITAMIN GUMMIES ADULT PO) Take by mouth daily.     No current facility-administered medications for this visit.      ALLERGIES: Flagyl [metronidazole]  Family History  Problem Relation Age of Onset  . Lupus Father   . Kidney failure Father        associated with Lupus  . Anxiety disorder Mother   . Mood Disorder Mother   . Aneurysm Paternal Grandmother   . Alcohol abuse Paternal Grandmother   . Breast cancer Maternal Grandmother   . Cancer Paternal Grandfather        unsure type    Social History   Socioeconomic History  . Marital status: Single    Spouse name: Not on file  . Number of children: Not on file  . Years of education: Not on file  . Highest education level: Not on file  Occupational History  . Not on file  Social Needs  . Financial resource strain: Not on file  . Food insecurity:    Worry: Not on file    Inability: Not on file  .  Transportation needs:    Medical: Not on file    Non-medical: Not on file  Tobacco Use  . Smoking status: Never Smoker  . Smokeless tobacco: Never Used  Substance and Sexual Activity  . Alcohol use: Yes    Alcohol/week: 1.2 - 3.6 oz    Types: 2 - 6 Standard drinks or equivalent per week    Comment: weekends  . Drug use: No  . Sexual activity: Yes    Partners: Male    Birth control/protection: IUD, Condom    Comment: Kyleena inserted 06/19/17  Lifestyle  . Physical activity:    Days per week: Not on file    Minutes per session: Not on file  . Stress: Not on file  Relationships  . Social connections:    Talks on phone: Not on file    Gets together: Not on file    Attends religious service: Not on file    Active member of club or organization: Not on file    Attends meetings of clubs or organizations: Not on file    Relationship status: Not on file  . Intimate partner violence:    Fear of current or ex partner: Not on file    Emotionally abused: Not on file     Physically abused: Not on file    Forced sexual activity: Not on file  Other Topics Concern  . Not on file  Social History Narrative  . Not on file    Review of Systems  Constitutional:       Chills Weight gain  HENT: Negative.   Eyes: Negative.   Respiratory: Negative.   Cardiovascular: Negative.   Gastrointestinal: Positive for nausea.  Endocrine:       Craving sweets Excessive thirst Cold intolerance  Genitourinary:       Menstrual cycle changes Pain with intercourse  Musculoskeletal: Negative.   Skin: Negative.   Allergic/Immunologic: Negative.   Neurological: Negative.   Hematological: Negative.   Psychiatric/Behavioral: Negative.     PHYSICAL EXAMINATION:    BP (!) 100/58 (BP Location: Right Arm, Patient Position: Sitting, Cuff Size: Normal)   Pulse 80   Resp 16   Ht 5' 0.75" (1.543 m)   Wt 108 lb (49 kg)   LMP 07/12/2017 (Within Weeks)   BMI 20.57 kg/m     General appearance: alert, cooperative and appears stated age   Breasts: normal appearance, no masses or tenderness, No nipple retraction or dimpling, No nipple discharge or bleeding, No axillary or supraclavicular adenopathy  Pelvic: External genitalia:  no lesions              Urethra:  normal appearing urethra with no masses, tenderness or lesions              Bartholins and Skenes: normal                 Vagina: normal appearing vagina with normal color and discharge, no lesions              Cervix: no lesions.  IUD strings noted.                 Bimanual Exam:  Uterus:  normal size, contour, position, consistency, mobility, non-tender              Adnexa: no mass, fullness, tenderness        Chaperone was present for exam.  ASSESSMENT  Kyleena IUD patient.   Dyspareunia.  Hx BV.  Breast tenderness and  weight gain.  Normal BMI.   PLAN  Affirm.  Try condoms without spermicide.  Try cooking oils for added lubrication.  We talked about weight gain and that she had lost prior to  placement of the IUD. I encouraged her to continue with [facility] a little longer, but that the choice was hers if she wanted to try another contraceptive.    An After Visit Summary was printed and given to the patient.  ___15___ minutes face to face time of which over 50% was spent in counseling.

## 2017-09-26 ENCOUNTER — Other Ambulatory Visit: Payer: Self-pay | Admitting: *Deleted

## 2017-09-26 LAB — VAGINITIS/VAGINOSIS, DNA PROBE
Candida Species: NEGATIVE
Gardnerella vaginalis: POSITIVE — AB
Trichomonas vaginosis: NEGATIVE

## 2017-09-26 MED ORDER — METRONIDAZOLE 0.75 % VA GEL: 1.0000 | Freq: Every day | VAGINAL | 0 refills | Status: AC

## 2018-02-07 ENCOUNTER — Ambulatory Visit: Payer: 59 | Admitting: Family Medicine

## 2018-06-11 ENCOUNTER — Ambulatory Visit: Payer: 59 | Admitting: Obstetrics and Gynecology

## 2018-07-14 ENCOUNTER — Emergency Department: Payer: Self-pay

## 2018-07-14 ENCOUNTER — Emergency Department: Payer: BC Managed Care – PPO

## 2018-07-14 ENCOUNTER — Other Ambulatory Visit: Payer: Self-pay

## 2018-07-14 ENCOUNTER — Encounter: Payer: Self-pay | Admitting: Emergency Medicine

## 2018-07-14 ENCOUNTER — Emergency Department
Admission: EM | Admit: 2018-07-14 | Discharge: 2018-07-14 | Disposition: A | Discharge status: Home / Self Care | Payer: BC Managed Care – PPO | Attending: Emergency Medicine | Admitting: Emergency Medicine

## 2018-07-14 DIAGNOSIS — S62623A Displaced fracture of medial phalanx of left middle finger, initial encounter for closed fracture: Secondary | ICD-10-CM | POA: Diagnosis not present

## 2018-07-14 DIAGNOSIS — Y92008 Other place in unspecified non-institutional (private) residence as the place of occurrence of the external cause: Secondary | ICD-10-CM | POA: Insufficient documentation

## 2018-07-14 DIAGNOSIS — M3210 Systemic lupus erythematosus, organ or system involvement unspecified: Secondary | ICD-10-CM | POA: Insufficient documentation

## 2018-07-14 DIAGNOSIS — S6992XA Unspecified injury of left wrist, hand and finger(s), initial encounter: Secondary | ICD-10-CM | POA: Diagnosis present

## 2018-07-14 DIAGNOSIS — Z79899 Other long term (current) drug therapy: Secondary | ICD-10-CM | POA: Insufficient documentation

## 2018-07-14 DIAGNOSIS — Y999 Unspecified external cause status: Secondary | ICD-10-CM | POA: Insufficient documentation

## 2018-07-14 DIAGNOSIS — T1490XA Injury, unspecified, initial encounter: Secondary | ICD-10-CM

## 2018-07-14 DIAGNOSIS — Y9389 Activity, other specified: Secondary | ICD-10-CM | POA: Insufficient documentation

## 2018-07-14 DIAGNOSIS — W108XXA Fall (on) (from) other stairs and steps, initial encounter: Secondary | ICD-10-CM | POA: Diagnosis not present

## 2018-07-14 MED ORDER — LIDOCAINE HCL (PF) 1 % IJ SOLN
5.0000 mL | Freq: Once | INTRAMUSCULAR | Status: AC
  Administered 2018-07-14: 16:00:00 5 mL via INTRADERMAL
  Filled 2018-07-14: qty 5

## 2018-07-14 NOTE — ED Triage Notes (Signed)
States injured finger catching self 4 days ago. L 3rd finger noted in splint. States was seen at [facility] and sent here for advice. Comes with disc.

## 2018-07-14 NOTE — Discharge Instructions (Signed)
You have a displaced fracture to your left middle finger.  They will have to still put your finger back in place early next week.  Please call Dr. Harlow Mares with orthopedics or Dr. Peggye Ley with hand orthopedics on Monday for an appointment early next week.

## 2018-07-14 NOTE — ED Provider Notes (Signed)
[facility] Emergency Department Provider Note  ____________________________________________  Time seen: Approximately 3:46 PM  I have reviewed the triage vital signs and the nursing notes.   HISTORY  Chief Complaint Finger Injury    HPI Samantha Davies is a 23 y.o. female that presents emergency department for evaluation of left middle finger fracture 4 days ago.  She fell down some steps when she was moving but did not think that her finger was broken so never followed up.  Patient went to next care and it shows on her discharge paperwork that she is supposed to follow-up with orthopedic surgery ASAP.  She followed the address that was listed on her sheet which did not come up as an address and the closest next place was the emergency department so she suspected they meant here.  Upon closer look at her discharge instructions they recommended that she [facility] with [facility] on Monday.    Past Medical History:  Diagnosis Date  . Anxiety   . Depression   . Lupus ([place]) 2005    Patient Active Problem List   Diagnosis Date Noted  . H/O urinary tract infection 09/09/2015  . 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 History of urinary tract infection 05/15/2012  . COMMON MIGRAINE 09/10/2006  . Systemic lupus erythematosus ([place]) 09/10/2006    History reviewed. No pertinent surgical history.  Prior to Admission medications   Medication Sig Start Date End Date Taking? Authorizing Provider  belimumab (BENLYSTA) 120 MG SOLR injection Inject 10 mg/kg into the vein every 30 (thirty) days.    [provider]  ciprofloxacin (CIPRO) 500 MG tablet TK 1 T PO BID FOR 7 DAYS 09/08/17   [provider]  Fluocinolone Acetonide Scalp 0.01 % OIL APP EXT AA BID FOR 14 DAYS 06/13/17   [provider]  Levonorgestrel (KYLEENA) 19.5 MG IUD by Intrauterine route.    [provider]  Multiple  Vitamins-Minerals (MULTIVITAMIN GUMMIES ADULT PO) Take by mouth daily.    [provider]    Allergies Flagyl [metronidazole]  Family History  Problem Relation Age of Onset  . Lupus Father   . Kidney failure Father        associated with Lupus  . Anxiety disorder Mother   . Mood Disorder Mother   . Aneurysm Paternal Grandmother   . Alcohol abuse Paternal Grandmother   . Breast cancer Maternal Grandmother   . Cancer Paternal Grandfather        unsure type    Social History Social History   Tobacco Use  . Smoking status: Never Smoker  . Smokeless tobacco: Never Used  Substance Use Topics  . Alcohol use: Yes    Alcohol/week: 2.0 - 6.0 standard drinks    Types: 2 - 6 Standard drinks or equivalent per week    Comment: weekends  . Drug use: No     Review of Systems  Gastrointestinal: No nausea, no vomiting.  Musculoskeletal: Positive for finger pain. Skin: Negative for rash, abrasions, lacerations, ecchymosis. Neurological: Negative for numbness.  Positive for occasional tingling.   ____________________________________________   PHYSICAL EXAM:  VITAL SIGNS: ED Triage Vitals  Enc Vitals Group     BP 07/14/18 1345 101/65     Pulse Rate 07/14/18 1345 79     Resp 07/14/18 1345 20     Temp 07/14/18 1345 98.3 F (36.8 C)     Temp Source 07/14/18 1345 Oral     SpO2 07/14/18 1345  98 %     Weight 07/14/18 1346 115 lb (52.2 kg)     Height 07/14/18 1346 5' 3.75" (1.619 m)     Head Circumference --      Peak Flow --      Pain Score 07/14/18 1346 2     Pain Loc --      Pain Edu? --      Excl. in [place]? --      Constitutional: Alert and oriented. Well appearing and in no acute distress. Eyes: Conjunctivae are normal. PERRL. EOMI. Head: Atraumatic. ENT:      Ears:      Nose: No congestion/rhinnorhea.      Mouth/Throat: Mucous membranes are moist.  Neck: No stridor.   Cardiovascular: Normal rate, regular rhythm.  Good peripheral circulation.  Cap refill  less than 2 seconds bilaterally. Respiratory: Normal respiratory effort without tachypnea or retractions. Lungs CTAB. Good air entry to the bases with no decreased or absent breath sounds. Musculoskeletal: Full range of motion to all extremities. No gross deformities appreciated.  No swelling to left middle finger. Neurologic:  Normal speech and language. No gross focal neurologic deficits are appreciated.  Skin:  Skin is warm, dry and intact. No rash noted. Psychiatric: Mood and affect are normal. Speech and behavior are normal. Patient exhibits appropriate insight and judgement.   ____________________________________________   LABS (all labs ordered are listed, but only abnormal results are displayed)  Labs Reviewed - No data to display ____________________________________________  EKG   ____________________________________________  RADIOLOGY Robinette Haines, personally viewed and evaluated these images (plain radiographs) as part of my medical decision making, as well as reviewing the written report by the radiologist.  Dg Finger Middle Left  Result Date: 07/14/2018 CLINICAL DATA:  Fall with middle finger swelling EXAM: LEFT MIDDLE FINGER 2+V COMPARISON:  None. FINDINGS: There is an oblique fracture through the shaft and distal aspect of the middle phalanx. The fracture is displaced by 1 mm at the distal aspect. No dislocation. Associated soft tissue swelling. IMPRESSION: Mildly displaced fracture of the third middle phalanx. Electronically Signed   By: Monte Fantasia M.D.   On: 07/14/2018 15:13    ____________________________________________    PROCEDURES  Procedure(s) performed:    .Ortho Injury Treatment Date/Time: 07/14/2018 3:52 PM Performed by: Laban Emperor, PA-C Authorized by: Laban Emperor, PA-C   Consent:    Consent obtained:  Verbal   Consent given by:  Patient   Risks discussed:  Stiffness, fracture and nerve damage   Alternatives discussed:  No  treatment, alternative treatment, immobilization, referral and delayed treatmentInjury location: finger Location details: left long finger Pre-procedure neurovascular assessment: neurovascularly intact Pre-procedure distal perfusion: normal Pre-procedure neurological function: normal Pre-procedure range of motion: normal  Anesthesia: Local anesthesia used: yes Local Anesthetic: lidocaine 1% without epinephrine  Patient sedated: NoImmobilization: splint Splint type: static finger Supplies used: aluminum splint Post-procedure neurovascular assessment: post-procedure neurovascularly intact Post-procedure distal perfusion: normal Post-procedure neurological function: normal Post-procedure range of motion: normal Patient tolerance: Patient tolerated the procedure well with no immediate complications       Medications  lidocaine (PF) (XYLOCAINE) 1 % injection 5 mL (has no administration in time range)     ____________________________________________   INITIAL IMPRESSION / ASSESSMENT AND PLAN / ED COURSE  Pertinent labs & imaging results that were available during my care of the patient were reviewed by me and considered in my medical decision making (see chart for details).  Review of the [place] CSRS was  performed in accordance of the [place] prior to dispensing any controlled drugs.   Patient's diagnosis is consistent with middle phalanx fracture.  X-ray consistent with fracture.  I numbed her finger for pain control and patient elected for me to attempt to reduce her finger while it was numb.  Reduction was attempted in the emergency department without success.  She is agreeable to follow-up with orthopedics for reduction on Monday.  Finger was resplinted.  Patient is to follow up with orthopedics as directed.  Referral was given to Dr. Harlow Mares and Dr. Peggye Ley and patient will call on Monday.  Patient is given ED precautions to return to the ED for any worsening or new  symptoms.     ____________________________________________  FINAL CLINICAL IMPRESSION(S) / ED DIAGNOSES  Final diagnoses:  Closed displaced fracture of middle phalanx of left middle finger, initial encounter      NEW MEDICATIONS STARTED DURING THIS VISIT:  ED Discharge Orders    None          This chart was dictated using voice recognition software/Dragon. Despite best efforts to proofread, errors can occur which can change the meaning. Any change was purely unintentional.    Laban Emperor, PA-C 07/14/18 1616    Nena Polio, MD 07/25/18 [id number]

## 2018-08-09 ENCOUNTER — Other Ambulatory Visit: Payer: Self-pay

## 2018-08-13 ENCOUNTER — Encounter: Payer: Self-pay | Admitting: Obstetrics and Gynecology

## 2018-08-13 ENCOUNTER — Other Ambulatory Visit: Payer: Self-pay

## 2018-08-13 ENCOUNTER — Other Ambulatory Visit (HOSPITAL_COMMUNITY)
Admission: RE | Admit: 2018-08-13 | Discharge: 2018-08-13 | Disposition: A | Discharge status: Home / Self Care | Payer: BC Managed Care – PPO | Source: Ambulatory Visit | Attending: Obstetrics and Gynecology | Admitting: Obstetrics and Gynecology

## 2018-08-13 ENCOUNTER — Ambulatory Visit: Payer: BC Managed Care – PPO | Admitting: Obstetrics and Gynecology

## 2018-08-13 VITALS — BP 104/58 | HR 80 | Temp 98.2°F | Resp 14 | Ht 62.0 in | Wt 109.6 lb

## 2018-08-13 DIAGNOSIS — Z113 Encounter for screening for infections with a predominantly sexual mode of transmission: Secondary | ICD-10-CM | POA: Diagnosis not present

## 2018-08-13 DIAGNOSIS — Z01419 Encounter for gynecological examination (general) (routine) without abnormal findings: Secondary | ICD-10-CM | POA: Insufficient documentation

## 2018-08-13 NOTE — Patient Instructions (Signed)

## 2018-08-13 NOTE — Progress Notes (Signed)
23 y.o. G0P0000 Single African American female here for annual exam.    Having menses with [place].  She can skip a cycle.  Happy with her IUD.  New partner since February, 2020.  She met him through [employer] app. She wants STD testing.  They are living together.  He is buying a house.   She had surgery on her left hand due to fracture.   She had the flu this year.   She was furloughed due to Covid 19.   PCP:  Reginia Forts, MD  Patient's last menstrual period was 07/13/2018.           Sexually active: Yes.    -- new partner since February 2020 The current method of family planning is [place] IUD inserted 06/19/17.   No condoms.  Exercising: No.  The patient does not participate in regular exercise at present. Smoker:  no  Health Maintenance: Pap:   06/06/17 Colpo showing LGSIL;05-15-17 LGSIL, 05-10-16 ASCUS:Pos HR HPV History of abnormal Pap:  yes TDaP:  2013 Gardasil:   Yes, completed series HIV and Hep C: 05/15/17 Negative Screening Labs: discuss if needed   reports that she has never smoked. She has never used smokeless tobacco. She reports current alcohol use of about 2.0 - 6.0 standard drinks of alcohol per week. She reports that she does not use drugs.  Past Medical History:  Diagnosis Date  . Alopecia   . Anxiety   . Depression   . Lupus ([place]) 2005    No past surgical history on file.  Current Outpatient Medications  Medication Sig Dispense Refill  . ibuprofen (ADVIL) 800 MG tablet Take 800 mg by mouth as needed.    . Levonorgestrel (KYLEENA) 19.5 MG IUD by Intrauterine route.    . Multiple Vitamins-Minerals (MULTIVITAMIN GUMMIES ADULT PO) Take by mouth daily.    . OXYCODONE HCL PO Take 5 mg by mouth as needed.    . belimumab (BENLYSTA) 120 MG SOLR injection Inject 10 mg/kg into the vein every 30 (thirty) days.    . Fluocinolone Acetonide Scalp 0.01 % OIL APP EXT AA BID FOR 14 DAYS  2   No current facility-administered medications for this visit.     Family  History  Problem Relation Age of Onset  . Lupus Father   . Kidney failure Father        associated with Lupus  . Anxiety disorder Mother   . Mood Disorder Mother   . Aneurysm Paternal Grandmother   . Alcohol abuse Paternal Grandmother   . Breast cancer Maternal Grandmother   . Cancer Paternal Grandfather        unsure type    Review of Systems  Constitutional: Negative.   HENT: Negative.   Eyes: Negative.   Respiratory: Negative.   Cardiovascular: Negative.   Gastrointestinal: Negative.   Endocrine: Negative.   Genitourinary: Negative.   Musculoskeletal: Negative.   Skin: Negative.   Allergic/Immunologic: Negative.   Neurological: Negative.   Hematological: Negative.   Psychiatric/Behavioral: Negative.     Exam:   BP (!) 104/58 (BP Location: Right Arm, Patient Position: Sitting, Cuff Size: Normal)   Pulse 80   Temp 98.2 F (36.8 C) (Temporal)   Resp 14   Ht '5\' 2"'  (1.575 m)   Wt 109 lb 9.6 oz (49.7 kg)   LMP 07/13/2018   BMI 20.05 kg/m     General appearance: alert, cooperative and appears stated age Head: normocephalic, without obvious abnormality, atraumatic Neck: no adenopathy, supple,  symmetrical, trachea midline and thyroid normal to inspection and palpation Lungs: clear to auscultation bilaterally Breasts: normal appearance, no masses or tenderness, No nipple retraction or dimpling, No nipple discharge or bleeding, No axillary adenopathy Heart: regular rate and rhythm Abdomen: soft, non-tender; no masses, no organomegaly Extremities: extremities normal, atraumatic, no cyanosis or edema Skin: skin color, texture, turgor normal. No rashes or lesions Lymph nodes: cervical, supraclavicular, and axillary nodes normal. Neurologic: grossly normal  Pelvic: External genitalia:  no lesions              No abnormal inguinal nodes palpated.              Urethra:  normal appearing urethra with no masses, tenderness or lesions              Bartholins and Skenes: normal                  Vagina: normal appearing vagina with normal color and discharge, no lesions              Cervix: no lesions.  IUD strings noted.               Pap taken: Yes.   Bimanual Exam:  Uterus:  normal size, contour, position, consistency, mobility, non-tender              Adnexa: no mass, fullness, tenderness               Chaperone was present for exam.  Assessment:   Well woman visit with normal exam. Kyleena IUD placed 06/2017.  Hx LGSIL.  Immunosuppressive medication.   Plan: Mammogram screening age 40. Self breast awareness reviewed. Pap and HR HPV as above. Guidelines for Calcium, Vitamin D, regular exercise program including cardiovascular and weight bearing exercise. STD screening.  Follow up annually and prn.   After visit summary provided.

## 2018-08-14 LAB — HEP, RPR, HIV PANEL
HIV Screen 4th Generation wRfx: NONREACTIVE
Hepatitis B Surface Ag: NEGATIVE
RPR Ser Ql: NONREACTIVE

## 2018-08-14 LAB — HEPATITIS C ANTIBODY: Hep C Virus Ab: <0.1 s/co ratio (ref 0.0–0.9)

## 2018-08-15 LAB — CYTOLOGY - PAP
Chlamydia: NEGATIVE
Diagnosis: NEGATIVE
Neisseria Gonorrhea: NEGATIVE
Trichomonas: NEGATIVE

## 2019-01-29 DIAGNOSIS — F331 Major depressive disorder, recurrent, moderate: Secondary | ICD-10-CM | POA: Insufficient documentation

## 2019-04-08 DIAGNOSIS — Z79899 Other long term (current) drug therapy: Secondary | ICD-10-CM | POA: Insufficient documentation

## 2019-08-16 ENCOUNTER — Ambulatory Visit: Payer: BC Managed Care – PPO | Admitting: Obstetrics and Gynecology

## 2019-08-20 ENCOUNTER — Ambulatory Visit: Payer: BC Managed Care – PPO | Admitting: Obstetrics and Gynecology

## 2019-08-20 ENCOUNTER — Other Ambulatory Visit: Payer: Self-pay

## 2019-08-20 ENCOUNTER — Encounter: Payer: Self-pay | Admitting: Obstetrics and Gynecology

## 2019-08-20 ENCOUNTER — Other Ambulatory Visit (HOSPITAL_COMMUNITY)
Admission: RE | Admit: 2019-08-20 | Discharge: 2019-08-20 | Disposition: A | Discharge status: Home / Self Care | Payer: BC Managed Care – PPO | Source: Ambulatory Visit | Attending: Obstetrics and Gynecology | Admitting: Obstetrics and Gynecology

## 2019-08-20 VITALS — BP 100/60 | HR 88 | Temp 97.2°F | Resp 22 | Ht 62.0 in | Wt 113.8 lb

## 2019-08-20 DIAGNOSIS — Z01419 Encounter for gynecological examination (general) (routine) without abnormal findings: Secondary | ICD-10-CM | POA: Insufficient documentation

## 2019-08-20 DIAGNOSIS — Z113 Encounter for screening for infections with a predominantly sexual mode of transmission: Secondary | ICD-10-CM

## 2019-08-20 NOTE — Patient Instructions (Signed)

## 2019-08-20 NOTE — Progress Notes (Signed)
24 y.o. G0P0000 Single African American female here for annual exam.    Menses are every other month.  Happy with Verdia Kuba.   Wants STD testing.   Graduated.  Looking for internships.  Going on vacation to [place].  PCP: Tracie Harrier, MD   Rheumatology:  Dr. Fransisco [facility].   No LMP recorded. (Menstrual status: IUD).     Period Cycle (Days): (Kyleena IUD--has cycle maybe every other month)     Sexually active: Yes.    The current method of family planning is IUD--Kyleena 06/19/17.    Exercising: Yes.    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  Health Maintenance: Pap: 08-13-18 Neg,05-15-17 LGSIL, 05-10-16 ASCUS:Pos HR HPV. 04-10-15 ASCUS:Pos HR HPV History of abnormal Pap:  Yes, 05-15-17 LGSIL w/colpo showing LGSIL,05-10-16 ASCUS:Pos HR HPV, 04/10/15 ASCUS (+) HR HPV MMG:  n/a Colonoscopy:  n/a BMD:   n/a  Result  n/a TDaP: 2013 Gardasil:   yes HIV: 05-15-17 NR Hep C: 05-15-17 Neg Screening Labs:  PCP.    reports that she has never smoked. She has never used smokeless tobacco. She reports current alcohol use. She reports current drug use. Frequency: 7.00 times per week. Drug: Marijuana.  Past Medical History:  Diagnosis Date  . Alopecia   . Anxiety   . Depression   . Lupus ([place]) 2005    Past Surgical History:  Procedure Laterality Date  . broken finger Left 06/2017    Current Outpatient Medications  Medication Sig Dispense Refill  . Belimumab (BENLYSTA) 200 MG/ML SOAJ Inject 1 Dose into the skin once a week.    . Fluocinolone Acetonide Scalp 0.01 % OIL APP EXT AA BID FOR 14 DAYS  2  . FLUoxetine (PROZAC) 20 MG capsule Take 20 mg by mouth daily.    . hydroxychloroquine (PLAQUENIL) 200 MG tablet TAKE 1 1/2 TABLETS BY MOUTH DAILY FOR LUPUS    . Levonorgestrel (KYLEENA) 19.5 MG IUD by Intrauterine route.    . ondansetron (ZOFRAN-ODT) 8 MG disintegrating tablet SMARTSIG:1 Tablet(s) Sublingual PRN    . SUMAtriptan  (IMITREX) 100 MG tablet Take by mouth.     No current facility-administered medications for this visit.    Family History  Problem Relation Age of Onset  . Lupus Father   . Kidney failure Father        associated with Lupus  . Anxiety disorder Mother   . Mood Disorder Mother   . Aneurysm Paternal Grandmother   . Alcohol abuse Paternal Grandmother   . Breast cancer Maternal Grandmother   . Cancer Paternal Grandfather        unsure type    Review of Systems  All other systems reviewed and are negative.   Exam:   BP 100/60   Pulse 88   Temp (!) 97.2 F (36.2 C) (Temporal)   Resp (!) 22   Ht 5\' 2"  (1.575 m)   Wt 113 lb 12.8 oz (51.6 kg)   BMI 20.81 kg/m     General appearance: alert, cooperative and appears stated age Head: normocephalic, without obvious abnormality, atraumatic Neck: no adenopathy, supple, symmetrical, trachea midline and thyroid normal to inspection and palpation Lungs: clear to auscultation bilaterally Breasts: normal appearance, no masses or tenderness, No nipple retraction or dimpling, No nipple discharge or bleeding, No axillary adenopathy Heart: regular rate and rhythm Abdomen: soft, non-tender; no masses, no organomegaly Extremities: extremities normal, atraumatic, no cyanosis or edema Skin:  skin color, texture, turgor normal. No rashes or lesions Lymph nodes: cervical, supraclavicular, and axillary nodes normal. Neurologic: grossly normal  Pelvic: External genitalia:  no lesions              No abnormal inguinal nodes palpated.              Urethra:  normal appearing urethra with no masses, tenderness or lesions              Bartholins and Skenes: normal                 Vagina: normal appearing vagina with normal color and discharge, no lesions              Cervix: no lesions.  IUD string noted.  Old blood.               Pap taken: Yes.   Bimanual Exam:  Uterus:  normal size, contour, position, consistency, mobility, non-tender               Adnexa: no mass, fullness, tenderness            Chaperone was present for exam.  Assessment:   Well woman visit with normal exam. Kyleena IUD placed 06/2017.  Hx LGSIL.  Lupus.  Immunosuppressive medication.   Plan: Mammogram screening age 74.  Self breast awareness reviewed. Pap and HR HPV as above. Guidelines for Calcium, Vitamin D, regular exercise program including cardiovascular and weight bearing exercise. STD screening.  We discussed her marijuana use.  I encouraged her to see her rheumatologist if her pain is not well controlled. Follow up annually and prn.   After visit summary provided.

## 2019-08-21 LAB — HEP, RPR, HIV PANEL
HIV Screen 4th Generation wRfx: NONREACTIVE
Hepatitis B Surface Ag: NEGATIVE
RPR Ser Ql: NONREACTIVE

## 2019-08-21 LAB — HEPATITIS C ANTIBODY: Hep C Virus Ab: <0.1 s/co ratio (ref 0.0–0.9)

## 2019-08-22 LAB — CYTOLOGY - PAP
Chlamydia: NEGATIVE
Comment: NEGATIVE
Comment: NEGATIVE
Comment: NEGATIVE
Comment: NORMAL
Diagnosis: UNDETERMINED — AB
High risk HPV: NEGATIVE
Neisseria Gonorrhea: NEGATIVE
Trichomonas: NEGATIVE

## 2019-08-26 ENCOUNTER — Telehealth: Payer: Self-pay

## 2019-08-26 NOTE — Telephone Encounter (Signed)
-----   Message from Nunzio Cobbs, MD sent at 08/23/2019  7:24 AM EDT ----- Please contact patient with results of testing.  Her pap showed ASCUS and her HR HPV testing is negative.  I recommend cervical cancer screening in one year.  Please place 12 month recall.  She has a history of LGSIL.  Her testing is negative for GC/CT/trichomonas.

## 2019-08-26 NOTE — Telephone Encounter (Signed)
Left detailed message per DPR with results. Pt to return call to office with any questions or concerns.  12 recall placed.   Routing to Dr Quincy Simmonds for review. Encounter closed.

## 2020-08-19 NOTE — Progress Notes (Deleted)
25 y.o. G0P0000 Single {Race/ethnicity:17218} female here for annual exam.    PCP:     No LMP recorded. (Menstrual status: IUD).           Sexually active: {yes no:314532}  The current method of family planning is IUD.   Kyleena inserted 06-19-17 Exercising: {yes RA:[id number]}  {types:19826} Smoker:  {YES [id number]  Health Maintenance: Pap:  08-20-2019 ascus HPV HR neg, 08-13-2018 neg, 05-15-17 LGSIL, 04-30-16 ASCUS HPV HR+ History of abnormal Pap:  yes MMG:  none Colonoscopy:  none BMD:   none  Result  n/a TDaP:  2020 Gardasil:   yes HIV: neg 2021 Hep C: neg 2021 Screening Labs:  Hb today: ***, Urine today: ***   reports that she has never smoked. She has never used smokeless tobacco. She reports current alcohol use. She reports current drug use. Frequency: 7.00 times per week. Drug: Marijuana.  Past Medical History:  Diagnosis Date  . Alopecia   . Anxiety   . Depression   . Lupus ([place]) 2005    Past Surgical History:  Procedure Laterality Date  . broken finger Left 06/2017    Current Outpatient Medications  Medication Sig Dispense Refill  . Belimumab (BENLYSTA) 200 MG/ML SOAJ Inject 1 Dose into the skin once a week.    . Fluocinolone Acetonide Scalp 0.01 % OIL APP EXT AA BID FOR 14 DAYS  2  . FLUoxetine (PROZAC) 20 MG capsule Take 20 mg by mouth daily.    . hydroxychloroquine (PLAQUENIL) 200 MG tablet TAKE 1 1/2 TABLETS BY MOUTH DAILY FOR LUPUS    . Levonorgestrel (KYLEENA) 19.5 MG IUD by Intrauterine route.    . ondansetron (ZOFRAN-ODT) 8 MG disintegrating tablet SMARTSIG:1 Tablet(s) Sublingual PRN     No current facility-administered medications for this visit.    Family History  Problem Relation Age of Onset  . Lupus Father   . Kidney failure Father        associated with Lupus  . Anxiety disorder Mother   . Mood Disorder Mother   . Aneurysm Paternal Grandmother   . Alcohol abuse Paternal Grandmother   . Breast cancer Maternal Grandmother   . Cancer Paternal  Grandfather        unsure type    Review of Systems  Exam:   There were no vitals taken for this visit.    General appearance: alert, cooperative and appears stated age Head: normocephalic, without obvious abnormality, atraumatic Neck: no adenopathy, supple, symmetrical, trachea midline and thyroid normal to inspection and palpation Lungs: clear to auscultation bilaterally Breasts: normal appearance, no masses or tenderness, No nipple retraction or dimpling, No nipple discharge or bleeding, No axillary adenopathy Heart: regular rate and rhythm Abdomen: soft, non-tender; no masses, no organomegaly Extremities: extremities normal, atraumatic, no cyanosis or edema Skin: skin color, texture, turgor normal. No rashes or lesions Lymph nodes: cervical, supraclavicular, and axillary nodes normal. Neurologic: grossly normal  Pelvic: External genitalia:  no lesions              No abnormal inguinal nodes palpated.              Urethra:  normal appearing urethra with no masses, tenderness or lesions              Bartholins and Skenes: normal                 Vagina: normal appearing vagina with normal color and discharge, no lesions  Cervix: no lesions              Pap taken: {yes no:314532} Bimanual Exam:  Uterus:  normal size, contour, position, consistency, mobility, non-tender              Adnexa: no mass, fullness, tenderness              Rectal exam: {yes no:314532}.  Confirms.              Anus:  normal sphincter tone, no lesions  Chaperone was present for exam.  Assessment:   Well woman visit with normal exam.   Plan: Mammogram screening discussed. Self breast awareness reviewed. Pap and HR HPV as above. Guidelines for Calcium, Vitamin D, regular exercise program including cardiovascular and weight bearing exercise.   Follow up annually and prn.   Additional counseling given.  {yes [id number]. _______ minutes face to face time of which over 50% was spent in  counseling.    After visit summary provided.

## 2020-08-20 ENCOUNTER — Ambulatory Visit: Payer: BC Managed Care – PPO | Admitting: Obstetrics and Gynecology

## 2020-08-20 DIAGNOSIS — Z0289 Encounter for other administrative examinations: Secondary | ICD-10-CM

## 2020-09-24 ENCOUNTER — Other Ambulatory Visit: Payer: Self-pay

## 2020-09-24 ENCOUNTER — Emergency Department
Admission: EM | Admit: 2020-09-24 | Discharge: 2020-09-24 | Disposition: A | Discharge status: Home / Self Care | Payer: BC Managed Care – PPO | Attending: Emergency Medicine | Admitting: Emergency Medicine

## 2020-09-24 DIAGNOSIS — M3210 Systemic lupus erythematosus, organ or system involvement unspecified: Secondary | ICD-10-CM | POA: Insufficient documentation

## 2020-09-24 DIAGNOSIS — R197 Diarrhea, unspecified: Secondary | ICD-10-CM | POA: Insufficient documentation

## 2020-09-24 DIAGNOSIS — Z20822 Contact with and (suspected) exposure to covid-19: Secondary | ICD-10-CM | POA: Diagnosis not present

## 2020-09-24 DIAGNOSIS — R809 Proteinuria, unspecified: Secondary | ICD-10-CM | POA: Insufficient documentation

## 2020-09-24 DIAGNOSIS — R824 Acetonuria: Secondary | ICD-10-CM | POA: Insufficient documentation

## 2020-09-24 DIAGNOSIS — R110 Nausea: Secondary | ICD-10-CM | POA: Insufficient documentation

## 2020-09-24 DIAGNOSIS — R519 Headache, unspecified: Secondary | ICD-10-CM | POA: Diagnosis present

## 2020-09-24 DIAGNOSIS — M329 Systemic lupus erythematosus, unspecified: Secondary | ICD-10-CM

## 2020-09-24 LAB — COMPREHENSIVE METABOLIC PANEL
ALT: 12 U/L (ref 0–44)
AST: 28 U/L (ref 15–41)
Albumin: 4.3 g/dL (ref 3.5–5.0)
Alkaline Phosphatase: 59 U/L (ref 38–126)
Anion gap: 9 (ref 5–15)
BUN: 7 mg/dL (ref 6–20)
CO2: 24 mmol/L (ref 22–32)
Calcium: 9.3 mg/dL (ref 8.9–10.3)
Chloride: 104 mmol/L (ref 98–111)
Creatinine, Ser: 0.65 mg/dL (ref 0.44–1.00)
GFR, Estimated: >60 mL/min (ref >60)
Glucose, Bld: 90 mg/dL (ref 70–99)
Potassium: 3.7 mmol/L (ref 3.5–5.1)
Sodium: 137 mmol/L (ref 135–145)
Total Bilirubin: 0.9 mg/dL (ref 0.3–1.2)
Total Protein: 8.5 g/dL — ABNORMAL HIGH (ref 6.5–8.1)

## 2020-09-24 LAB — URINALYSIS, COMPLETE (UACMP) WITH MICROSCOPIC
Bilirubin Urine: NEGATIVE
Glucose, UA: NEGATIVE mg/dL
Hgb urine dipstick: NEGATIVE
Ketones, ur: 5 mg/dL — AB
Leukocytes,Ua: NEGATIVE
Nitrite: NEGATIVE
Protein, ur: 30 mg/dL — AB
Specific Gravity, Urine: 1.015 (ref 1.005–1.030)
pH: 7 (ref 5.0–8.0)

## 2020-09-24 LAB — CBC WITH DIFFERENTIAL/PLATELET
Abs Immature Granulocytes: 0.01 10*3/uL (ref 0.00–0.07)
Basophils Absolute: 0 10*3/uL (ref 0.0–0.1)
Basophils Relative: 0 %
Eosinophils Absolute: 0 10*3/uL (ref 0.0–0.5)
Eosinophils Relative: 0 %
HCT: 36.4 % (ref 36.0–46.0)
Hemoglobin: 12.5 g/dL (ref 12.0–15.0)
Immature Granulocytes: 0 %
Lymphocytes Relative: 21 %
Lymphs Abs: 1.1 10*3/uL (ref 0.7–4.0)
MCH: 28.2 pg (ref 26.0–34.0)
MCHC: 34.3 g/dL (ref 30.0–36.0)
MCV: 82.2 fL (ref 80.0–100.0)
Monocytes Absolute: 0.3 10*3/uL (ref 0.1–1.0)
Monocytes Relative: 5 %
Neutro Abs: 3.8 10*3/uL (ref 1.7–7.7)
Neutrophils Relative %: 74 %
Platelets: 221 10*3/uL (ref 150–400)
RBC: 4.43 MIL/uL (ref 3.87–5.11)
RDW: 14 % (ref 11.5–15.5)
WBC: 5.2 10*3/uL (ref 4.0–10.5)
nRBC: 0 % (ref 0.0–0.2)

## 2020-09-24 LAB — POC URINE PREG, ED: Preg Test, Ur: NEGATIVE

## 2020-09-24 LAB — RESP PANEL BY RT-PCR (FLU A&B, COVID) ARPGX2
Influenza A by PCR: NEGATIVE
Influenza B by PCR: NEGATIVE
SARS Coronavirus 2 by RT PCR: NEGATIVE

## 2020-09-24 MED ORDER — SODIUM CHLORIDE 0.9 % IV BOLUS
1000.0000 mL | Freq: Once | INTRAVENOUS | Status: AC
  Administered 2020-09-24: 1000 mL via INTRAVENOUS

## 2020-09-24 MED ORDER — PREDNISONE 10 MG (21) PO TBPK: ORAL_TABLET | ORAL | 0 refills | Status: DC

## 2020-09-24 MED ORDER — DIPHENHYDRAMINE HCL 50 MG/ML IJ SOLN
50.0000 mg | Freq: Once | INTRAMUSCULAR | Status: AC
  Administered 2020-09-24: 50 mg via INTRAVENOUS
  Filled 2020-09-24: qty 1

## 2020-09-24 MED ORDER — METHYLPREDNISOLONE SODIUM SUCC 125 MG IJ SOLR
125.0000 mg | Freq: Once | INTRAMUSCULAR | Status: AC
  Administered 2020-09-24: 125 mg via INTRAVENOUS
  Filled 2020-09-24: qty 2

## 2020-09-24 MED ORDER — ACETAMINOPHEN 325 MG PO TABS
650.0000 mg | ORAL_TABLET | Freq: Once | ORAL | Status: AC
  Administered 2020-09-24: 650 mg via ORAL
  Filled 2020-09-24: qty 2

## 2020-09-24 MED ORDER — METOCLOPRAMIDE HCL 5 MG/ML IJ SOLN
10.0000 mg | Freq: Once | INTRAMUSCULAR | Status: AC
  Administered 2020-09-24: 10 mg via INTRAVENOUS
  Filled 2020-09-24: qty 2

## 2020-09-24 MED ORDER — KETOROLAC TROMETHAMINE 30 MG/ML IJ SOLN
30.0000 mg | Freq: Once | INTRAMUSCULAR | Status: AC
  Administered 2020-09-24: 30 mg via INTRAVENOUS
  Filled 2020-09-24: qty 1

## 2020-09-24 NOTE — ED Notes (Signed)
See triage note   Presents with h/a and body aches  Afebrile on arrival  Also has had some n/v

## 2020-09-24 NOTE — ED Provider Notes (Signed)
[facility]-EMERGENCY DEPARTMENT  ____________________________________________  Time seen: Approximately 3:33 PM  I have reviewed the triage vital signs and the nursing notes.   HISTORY  Chief Complaint Generalized Body Aches and Headache   Historian Patient     HPI Samantha Davies is a 25 y.o. female presents to the emergency department with headache, chills, body aches, nausea and diarrhea.  Patient states that she has had recent travel but has not been around sick contacts.  Patient has a history of lupus and feels like her current symptoms feel like lupus flares in the past.  Patient reports that she has responded well to IV steroids and oral steroids in the past.  No chest pain, chest tightness or abdominal pain.  Patient has had diminished appetite.   Past Medical History:  Diagnosis Date   Alopecia    Anxiety    Depression    Lupus ([place]) 2005     Immunizations up to date:  Yes.     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 Authorizing Provider  Belimumab (BENLYSTA) 200 MG/ML SOAJ Inject 1 Dose into the skin once a week. 03/06/19   [provider]  Fluocinolone Acetonide Scalp 0.01 % OIL APP EXT AA BID FOR 14 DAYS 06/13/17   [provider]  FLUoxetine (PROZAC) 20 MG capsule Take 20 mg by mouth daily. 07/17/19   [provider]  hydroxychloroquine (PLAQUENIL) 200 MG tablet TAKE 1 1/2 TABLETS BY MOUTH DAILY FOR LUPUS 07/17/19   [provider]  Levonorgestrel (KYLEENA) 19.5 MG IUD by Intrauterine route.    [provider]  ondansetron (ZOFRAN-ODT) 8 MG disintegrating tablet SMARTSIG:1 Tablet(s) Sublingual PRN 05/08/19   [provider]  predniSONE (STERAPRED UNI-PAK 21 TAB) 10 MG (21) TBPK tablet 6,5,4,3,2,1 09/24/20   Lannie Fields, PA-C    Allergies Flagyl [metronidazole]  Family History  Problem Relation Age of Onset   Lupus Father    Kidney failure Father        associated with Lupus   Anxiety disorder Mother    Mood Disorder Mother    Aneurysm Paternal Grandmother    Alcohol abuse Paternal Grandmother    Breast cancer Maternal Grandmother    Cancer Paternal Grandfather        unsure type    Social History Social History   Tobacco Use   Smoking status: Never   Smokeless tobacco: Never   Tobacco comments:    smokes marijuana daily  Vaping Use   Vaping Use: Never used  Substance Use Topics   Alcohol use: Yes    Comment: 1 drink per month   Drug use: Yes    Frequency: 7.0 times per week    Types: Marijuana     Review of Systems  Constitutional: Patient has chills.  Eyes:  No discharge ENT: No upper respiratory complaints. Respiratory: no cough. No SOB/ use of accessory muscles to breath Gastrointestinal: Patient has nausea.  Musculoskeletal: Patient has myalgias.  Skin: Negative for rash, abrasions, lacerations, ecchymosis.    ____________________________________________   PHYSICAL EXAM:  VITAL SIGNS: ED Triage Vitals [09/24/20 1224]  Enc Vitals Group     BP 102/60     Pulse Rate 91     Resp 17     Temp 98.3 F (36.8 C)     Temp Source Oral     SpO2      Weight 115 lb (52.2 kg)     Height 5\' 1"  (1.549 m)     Head Circumference      Peak Flow      Pain Score 9     Pain Loc      Pain Edu?      Excl. in [place]?      Constitutional: Alert and oriented. Patient is lying supine. Eyes: Conjunctivae are normal. PERRL. EOMI. Head: Atraumatic. ENT:       Ears: Tympanic membranes are mildly injected with mild effusion bilaterally.       Nose: No congestion/rhinnorhea.      Mouth/Throat: Mucous membranes are moist. Posterior pharynx is mildly erythematous.  Hematological/Lymphatic/Immunilogical: No cervical lymphadenopathy.  Cardiovascular: Normal rate, regular rhythm. Normal S1 and S2.  Good peripheral circulation. Respiratory: Normal respiratory effort without tachypnea or retractions. Lungs CTAB. Good air entry to the bases with no decreased or absent breath sounds. Gastrointestinal: Bowel sounds 4 quadrants. Soft and nontender to palpation. No guarding or rigidity. No palpable masses. No distention. No CVA tenderness. Musculoskeletal: Full range of motion to all extremities. No gross deformities appreciated. Neurologic:  Normal speech and language. No gross focal neurologic deficits are appreciated.  Skin:  Skin is warm, dry and intact. No rash noted. Psychiatric: Mood and affect are normal. Speech and behavior are normal. Patient exhibits appropriate insight and judgement.  ____________________________________________   LABS (all labs ordered are listed, but only abnormal results are displayed)  Labs Reviewed  URINALYSIS, COMPLETE (UACMP) WITH MICROSCOPIC - Abnormal; Notable for the following components:      Result Value   Color, Urine YELLOW (*)    APPearance CLEAR (*)    Ketones, ur 5 (*)    Protein, ur 30 (*)    Bacteria, UA RARE (*)    All other components within normal limits  COMPREHENSIVE METABOLIC PANEL - Abnormal; Notable for the following components:   Total Protein 8.5 (*)    All other components within normal limits  RESP PANEL BY RT-PCR (FLU A&B, COVID) ARPGX2  CBC WITH DIFFERENTIAL/PLATELET  POC URINE PREG, ED   ____________________________________________  EKG   ____________________________________________  RADIOLOGY Unk Pinto, personally viewed and evaluated these images (plain radiographs) as  part of my medical decision making, as well as reviewing the written report by the radiologist.    No results found.  ____________________________________________    PROCEDURES  Procedure(s) performed:     Procedures     Medications  sodium chloride 0.9 % bolus 1,000 mL (0 mLs Intravenous Stopped 09/24/20 1655)  methylPREDNISolone sodium succinate (SOLU-MEDROL) 125 mg/2 mL injection 125 mg (125 mg Intravenous Given 09/24/20 1556)  metoCLOPramide (REGLAN) injection 10 mg (10 mg Intravenous Given 09/24/20 1556)  acetaminophen (TYLENOL) tablet 650 mg (650 mg Oral Given 09/24/20 1556)  diphenhydrAMINE (BENADRYL) injection 50 mg (50 mg Intravenous Given 09/24/20 1556)  ketorolac (TORADOL) 30 MG/ML injection 30 mg (30 mg Intravenous Given 09/24/20 1556)     ____________________________________________   INITIAL IMPRESSION / ASSESSMENT AND PLAN / ED COURSE  Pertinent labs & imaging results that were available during my care of the patient were reviewed by me and considered in my medical decision making (see chart for details).      Assessment and Plan:  Chills Myalgias  Headache  25 year old female presents to the emergency department with chills, body aches, headache, nausea and diarrhea for the past 48 hours.  Vital signs were reassuring at triage.  On physical exam, patient was alert, active and nontoxic-appearing.  CBC and CMP were reassuring.  Urinalysis indicated proteinuria and ketonuria but no other concerning findings.  Urine pregnancy testing was negative.  Patient tested negative for COVID-19 and influenza.  Patient reported that she felt significantly improved after Tylenol, supplemental fluids, Toradol, Solu-Medrol and Reglan.  Patient discharged with tapered prednisone as I suspect early SLE exacerbation.  Patient feels comfortable being discharged at this time and to department with new or worsening  symptoms.  ____________________________________________  FINAL CLINICAL IMPRESSION(S) / ED DIAGNOSES  Final diagnoses:  SLE exacerbation ([place])      NEW MEDICATIONS STARTED DURING THIS VISIT:  ED Discharge Orders          Ordered    predniSONE (STERAPRED UNI-PAK 21 TAB) 10 MG (21) TBPK tablet  Status:  Discontinued        09/24/20 1802    predniSONE (STERAPRED UNI-PAK 21 TAB) 10 MG (21) TBPK tablet        09/24/20 1815                This chart was dictated using voice recognition software/Dragon. Despite best efforts to proofread, errors can occur which can change the meaning. Any change was purely unintentional.     Lannie Fields, PA-C 09/24/20 1826    Blake Divine, MD 09/24/20 2337

## 2020-09-24 NOTE — ED Provider Notes (Signed)
HPI: Pt is a 25 y.o. female who presents with complaints of   The patient p/w  chills and body aches does have h/o lupus on plaquenil   ROS: Denies fever, chest pain, vomiting  Past Medical History:  Diagnosis Date   Alopecia    Anxiety    Depression    Lupus ([place]) 2005   Vitals:   09/24/20 1224  BP: 102/60  Pulse: 91  Resp: 17  Temp: 98.3 F (36.8 C)    Focused Physical Exam: Gen: No acute distress Head: atraumatic, normocephalic Eyes: Extraocular movements grossly intact; conjunctiva clear CV: RRR Lung: No increased WOB, no stridor GI: ND, no obvious masses Neuro: Alert and awake  Medical Decision Making and Plan: Given the patient's initial medical screening exam, the following diagnostic evaluation has been ordered. The patient will be placed in the appropriate treatment space, once one is available, to complete the evaluation and treatment. I have discussed the plan of care with the patient and I have advised the patient that an ED physician or mid-level practitioner will reevaluate their condition after the test results have been received, as the results may give them additional insight into the type of treatment they may need.   Diagnostics: labs, covid, ua   Treatments: none immediately   Vanessa [place], MD 09/24/20 1230

## 2020-09-24 NOTE — Discharge Instructions (Addendum)
Take tapered steroid as directed.  

## 2020-09-24 NOTE — ED Triage Notes (Addendum)
Pt c/o chills, body aches , HA nausea and diarrhea states she feels like she is having a lupus flare, states she has been having the sx for the past 48hrs

## 2021-07-06 ENCOUNTER — Emergency Department: Payer: BC Managed Care – PPO | Admitting: Anesthesiology

## 2021-07-06 ENCOUNTER — Encounter
Admission: EM | Disposition: A | Discharge status: Home / Self Care | Payer: Self-pay | Source: Home / Self Care | Attending: Student in an Organized Health Care Education/Training Program

## 2021-07-06 ENCOUNTER — Emergency Department: Payer: BC Managed Care – PPO

## 2021-07-06 ENCOUNTER — Other Ambulatory Visit: Payer: Self-pay

## 2021-07-06 ENCOUNTER — Observation Stay
Admission: EM | Admit: 2021-07-06 | Discharge: 2021-07-07 | Disposition: A | Discharge status: Home / Self Care | Payer: BC Managed Care – PPO | Attending: Surgery | Admitting: Surgery

## 2021-07-06 DIAGNOSIS — Z79899 Other long term (current) drug therapy: Secondary | ICD-10-CM | POA: Insufficient documentation

## 2021-07-06 DIAGNOSIS — K3580 Unspecified acute appendicitis: Secondary | ICD-10-CM | POA: Diagnosis not present

## 2021-07-06 DIAGNOSIS — K353 Acute appendicitis with localized peritonitis, without perforation or gangrene: Secondary | ICD-10-CM

## 2021-07-06 DIAGNOSIS — K37 Unspecified appendicitis: Secondary | ICD-10-CM | POA: Diagnosis present

## 2021-07-06 DIAGNOSIS — R1031 Right lower quadrant pain: Secondary | ICD-10-CM

## 2021-07-06 HISTORY — PX: LAPAROSCOPIC APPENDECTOMY: SHX408

## 2021-07-06 LAB — PREGNANCY, URINE: Preg Test, Ur: NEGATIVE

## 2021-07-06 LAB — COMPREHENSIVE METABOLIC PANEL
ALT: 35 U/L (ref 0–44)
AST: 43 U/L — ABNORMAL HIGH (ref 15–41)
Albumin: 3.9 g/dL (ref 3.5–5.0)
Alkaline Phosphatase: 72 U/L (ref 38–126)
Anion gap: 8 (ref 5–15)
BUN: 6 mg/dL (ref 6–20)
CO2: 25 mmol/L (ref 22–32)
Calcium: 9 mg/dL (ref 8.9–10.3)
Chloride: 105 mmol/L (ref 98–111)
Creatinine, Ser: 0.68 mg/dL (ref 0.44–1.00)
GFR, Estimated: >60 mL/min (ref >60)
Glucose, Bld: 81 mg/dL (ref 70–99)
Potassium: 3.3 mmol/L — ABNORMAL LOW (ref 3.5–5.1)
Sodium: 138 mmol/L (ref 135–145)
Total Bilirubin: 0.9 mg/dL (ref 0.3–1.2)
Total Protein: 8.3 g/dL — ABNORMAL HIGH (ref 6.5–8.1)

## 2021-07-06 LAB — URINALYSIS, ROUTINE W REFLEX MICROSCOPIC
Bilirubin Urine: NEGATIVE
Glucose, UA: NEGATIVE mg/dL
Ketones, ur: 5 mg/dL — AB
Nitrite: POSITIVE — AB
Protein, ur: 30 mg/dL — AB
Specific Gravity, Urine: 1.008 (ref 1.005–1.030)
pH: 6 (ref 5.0–8.0)

## 2021-07-06 LAB — CBC
HCT: 32.9 % — ABNORMAL LOW (ref 36.0–46.0)
Hemoglobin: 11.2 g/dL — ABNORMAL LOW (ref 12.0–15.0)
MCH: 28.5 pg (ref 26.0–34.0)
MCHC: 34 g/dL (ref 30.0–36.0)
MCV: 83.7 fL (ref 80.0–100.0)
Platelets: 160 10*3/uL (ref 150–400)
RBC: 3.93 MIL/uL (ref 3.87–5.11)
RDW: 14.2 % (ref 11.5–15.5)
WBC: 8.7 10*3/uL (ref 4.0–10.5)
nRBC: 0 % (ref 0.0–0.2)

## 2021-07-06 LAB — POC URINE PREG, ED: Preg Test, Ur: NEGATIVE

## 2021-07-06 LAB — LIPASE, BLOOD: Lipase: 26 U/L (ref 11–51)

## 2021-07-06 SURGERY — APPENDECTOMY, LAPAROSCOPIC
Anesthesia: General | Site: Abdomen

## 2021-07-06 MED ORDER — SODIUM CHLORIDE 0.9 % IV SOLN: INTRAVENOUS | Status: DC

## 2021-07-06 MED ORDER — LIDOCAINE HCL (CARDIAC) PF 100 MG/5ML IV SOSY
PREFILLED_SYRINGE | INTRAVENOUS | Status: DC | PRN
  Administered 2021-07-06: 100 mg via INTRAVENOUS

## 2021-07-06 MED ORDER — FENTANYL CITRATE (PF) 100 MCG/2ML IJ SOLN
INTRAMUSCULAR | Status: DC | PRN
  Administered 2021-07-06 (×4): 50 ug via INTRAVENOUS

## 2021-07-06 MED ORDER — KETOROLAC TROMETHAMINE 30 MG/ML IJ SOLN
30.0000 mg | Freq: Four times a day (QID) | INTRAMUSCULAR | Status: DC
  Administered 2021-07-06 – 2021-07-07 (×3): 30 mg via INTRAVENOUS
  Filled 2021-07-06 (×3): qty 1

## 2021-07-06 MED ORDER — METHOCARBAMOL 500 MG PO TABS: 500.0000 mg | ORAL_TABLET | Freq: Three times a day (TID) | ORAL | Status: DC | PRN

## 2021-07-06 MED ORDER — IOHEXOL 300 MG/ML  SOLN
80.0000 mL | Freq: Once | INTRAMUSCULAR | Status: AC | PRN
  Administered 2021-07-06: 80 mL via INTRAVENOUS
  Filled 2021-07-06: qty 80

## 2021-07-06 MED ORDER — ENOXAPARIN SODIUM 40 MG/0.4ML IJ SOSY
40.0000 mg | PREFILLED_SYRINGE | INTRAMUSCULAR | Status: DC
  Administered 2021-07-07: 40 mg via SUBCUTANEOUS
  Filled 2021-07-06: qty 0.4

## 2021-07-06 MED ORDER — 0.9 % SODIUM CHLORIDE (POUR BTL) OPTIME
TOPICAL | Status: DC | PRN
  Administered 2021-07-06: 1000 mL

## 2021-07-06 MED ORDER — MIDAZOLAM HCL 2 MG/2ML IJ SOLN
INTRAMUSCULAR | Status: DC | PRN
  Administered 2021-07-06: 2 mg via INTRAVENOUS

## 2021-07-06 MED ORDER — PROPOFOL 10 MG/ML IV BOLUS
INTRAVENOUS | Status: DC | PRN
  Administered 2021-07-06: 120 mg via INTRAVENOUS

## 2021-07-06 MED ORDER — KETOROLAC TROMETHAMINE 30 MG/ML IJ SOLN
INTRAMUSCULAR | Status: DC | PRN
  Administered 2021-07-06: 30 mg via INTRAVENOUS

## 2021-07-06 MED ORDER — PROCHLORPERAZINE MALEATE 10 MG PO TABS
10.0000 mg | ORAL_TABLET | Freq: Four times a day (QID) | ORAL | Status: DC | PRN
  Filled 2021-07-06: qty 1

## 2021-07-06 MED ORDER — MELATONIN 5 MG PO TABS: 5.0000 mg | ORAL_TABLET | Freq: Every evening | ORAL | Status: DC | PRN

## 2021-07-06 MED ORDER — PANTOPRAZOLE SODIUM 40 MG IV SOLR
40.0000 mg | Freq: Every day | INTRAVENOUS | Status: DC
  Administered 2021-07-06: 40 mg via INTRAVENOUS
  Filled 2021-07-06: qty 10

## 2021-07-06 MED ORDER — PROCHLORPERAZINE EDISYLATE 10 MG/2ML IJ SOLN: 5.0000 mg | Freq: Four times a day (QID) | INTRAMUSCULAR | Status: DC | PRN

## 2021-07-06 MED ORDER — MORPHINE SULFATE (PF) 4 MG/ML IV SOLN
4.0000 mg | INTRAVENOUS | Status: DC | PRN
  Administered 2021-07-06 (×2): 4 mg via INTRAVENOUS
  Filled 2021-07-06 (×2): qty 1

## 2021-07-06 MED ORDER — PIPERACILLIN-TAZOBACTAM 3.375 G IVPB 30 MIN: 3.3750 g | Freq: Three times a day (TID) | INTRAVENOUS | Status: DC

## 2021-07-06 MED ORDER — SODIUM CHLORIDE 0.9 % IV SOLN
2.0000 g | Freq: Once | INTRAVENOUS | Status: AC
  Administered 2021-07-06: 2 g via INTRAVENOUS
  Filled 2021-07-06: qty 20

## 2021-07-06 MED ORDER — BUPIVACAINE-EPINEPHRINE (PF) 0.25% -1:200000 IJ SOLN
INTRAMUSCULAR | Status: DC | PRN
  Administered 2021-07-06: 50 mL via SURGICAL_CAVITY

## 2021-07-06 MED ORDER — SODIUM CHLORIDE 0.9 % IV BOLUS
1000.0000 mL | Freq: Once | INTRAVENOUS | Status: AC
  Administered 2021-07-06: 1000 mL via INTRAVENOUS

## 2021-07-06 MED ORDER — OXYCODONE HCL 5 MG/5ML PO SOLN: 5.0000 mg | Freq: Once | ORAL | Status: DC | PRN

## 2021-07-06 MED ORDER — ROCURONIUM BROMIDE 100 MG/10ML IV SOLN
INTRAVENOUS | Status: DC | PRN
  Administered 2021-07-06: 50 mg via INTRAVENOUS

## 2021-07-06 MED ORDER — SEVOFLURANE IN SOLN
RESPIRATORY_TRACT | Status: AC
  Filled 2021-07-06: qty 250

## 2021-07-06 MED ORDER — OXYCODONE HCL 5 MG PO TABS: 5.0000 mg | ORAL_TABLET | Freq: Once | ORAL | Status: DC | PRN

## 2021-07-06 MED ORDER — DIPHENHYDRAMINE HCL 25 MG PO CAPS: 25.0000 mg | ORAL_CAPSULE | Freq: Four times a day (QID) | ORAL | Status: DC | PRN

## 2021-07-06 MED ORDER — FENTANYL CITRATE (PF) 100 MCG/2ML IJ SOLN
INTRAMUSCULAR | Status: AC
  Filled 2021-07-06: qty 2

## 2021-07-06 MED ORDER — ONDANSETRON HCL 4 MG/2ML IJ SOLN
4.0000 mg | Freq: Once | INTRAMUSCULAR | Status: AC
  Administered 2021-07-06: 4 mg via INTRAVENOUS
  Filled 2021-07-06: qty 2

## 2021-07-06 MED ORDER — PIPERACILLIN-TAZOBACTAM 3.375 G IVPB
3.3750 g | Freq: Three times a day (TID) | INTRAVENOUS | Status: DC
  Administered 2021-07-06 – 2021-07-07 (×2): 3.375 g via INTRAVENOUS
  Filled 2021-07-06 (×2): qty 50

## 2021-07-06 MED ORDER — PREDNISONE 10 MG (21) PO TBPK: ORAL_TABLET | Freq: Every day | ORAL | Status: DC

## 2021-07-06 MED ORDER — ACETAMINOPHEN 10 MG/ML IV SOLN
INTRAVENOUS | Status: DC | PRN
  Administered 2021-07-06: 1000 mg via INTRAVENOUS

## 2021-07-06 MED ORDER — SUGAMMADEX SODIUM 200 MG/2ML IV SOLN
INTRAVENOUS | Status: DC | PRN
Start: 2021-07-06 — End: 2021-07-06
  Administered 2021-07-06: 250 mg via INTRAVENOUS

## 2021-07-06 MED ORDER — ONDANSETRON 4 MG PO TBDP
4.0000 mg | ORAL_TABLET | Freq: Four times a day (QID) | ORAL | Status: DC | PRN
Start: 2021-07-06 — End: 2021-07-07

## 2021-07-06 MED ORDER — FLUOXETINE HCL 20 MG PO CAPS
20.0000 mg | ORAL_CAPSULE | Freq: Every day | ORAL | Status: DC
  Administered 2021-07-07: 20 mg via ORAL
  Filled 2021-07-06: qty 1

## 2021-07-06 MED ORDER — PHENYLEPHRINE 80 MCG/ML (10ML) SYRINGE FOR IV PUSH (FOR BLOOD PRESSURE SUPPORT)
PREFILLED_SYRINGE | INTRAVENOUS | Status: DC | PRN
  Administered 2021-07-06 (×2): 80 ug via INTRAVENOUS

## 2021-07-06 MED ORDER — PHENYLEPHRINE HCL (PRESSORS) 10 MG/ML IV SOLN
INTRAVENOUS | Status: AC
  Filled 2021-07-06: qty 1

## 2021-07-06 MED ORDER — LACTATED RINGERS IV SOLN: INTRAVENOUS | Status: DC | PRN

## 2021-07-06 MED ORDER — FENTANYL CITRATE (PF) 100 MCG/2ML IJ SOLN: 25.0000 ug | INTRAMUSCULAR | Status: DC | PRN

## 2021-07-06 MED ORDER — PROPOFOL 10 MG/ML IV BOLUS
INTRAVENOUS | Status: AC
  Filled 2021-07-06: qty 20

## 2021-07-06 MED ORDER — BUPIVACAINE LIPOSOME 1.3 % IJ SUSP
INTRAMUSCULAR | Status: AC
  Filled 2021-07-06: qty 20

## 2021-07-06 MED ORDER — HYDROXYCHLOROQUINE SULFATE 200 MG PO TABS
200.0000 mg | ORAL_TABLET | Freq: Every day | ORAL | Status: DC
  Administered 2021-07-07: 200 mg via ORAL
  Filled 2021-07-06: qty 1

## 2021-07-06 MED ORDER — PIPERACILLIN-TAZOBACTAM 3.375 G IVPB 30 MIN
3.3750 g | Freq: Once | INTRAVENOUS | Status: AC
  Administered 2021-07-06: 3.375 g via INTRAVENOUS
  Filled 2021-07-06: qty 50

## 2021-07-06 MED ORDER — ONDANSETRON HCL 4 MG/2ML IJ SOLN
4.0000 mg | Freq: Four times a day (QID) | INTRAMUSCULAR | Status: DC | PRN
  Administered 2021-07-06: 4 mg via INTRAVENOUS
  Filled 2021-07-06: qty 2

## 2021-07-06 MED ORDER — ACETAMINOPHEN 500 MG PO TABS
1000.0000 mg | ORAL_TABLET | Freq: Four times a day (QID) | ORAL | Status: DC
  Administered 2021-07-07 (×2): 1000 mg via ORAL
  Filled 2021-07-06 (×2): qty 2

## 2021-07-06 MED ORDER — GLYCOPYRROLATE 0.2 MG/ML IJ SOLN
INTRAMUSCULAR | Status: DC | PRN
  Administered 2021-07-06: .2 mg via INTRAVENOUS

## 2021-07-06 MED ORDER — DIPHENHYDRAMINE HCL 50 MG/ML IJ SOLN: 25.0000 mg | Freq: Four times a day (QID) | INTRAMUSCULAR | Status: DC | PRN

## 2021-07-06 MED ORDER — DEXAMETHASONE SODIUM PHOSPHATE 10 MG/ML IJ SOLN
INTRAMUSCULAR | Status: DC | PRN
  Administered 2021-07-06: 10 mg via INTRAVENOUS

## 2021-07-06 MED ORDER — METHYLPREDNISOLONE SODIUM SUCC 125 MG IJ SOLR
INTRAMUSCULAR | Status: DC | PRN
  Administered 2021-07-06: 125 mg via INTRAVENOUS

## 2021-07-06 MED ORDER — METHOCARBAMOL 1000 MG/10ML IJ SOLN
500.0000 mg | Freq: Three times a day (TID) | INTRAVENOUS | Status: DC | PRN
  Filled 2021-07-06: qty 5

## 2021-07-06 MED ORDER — SUCCINYLCHOLINE CHLORIDE 200 MG/10ML IV SOSY
PREFILLED_SYRINGE | INTRAVENOUS | Status: DC | PRN
Start: 2021-07-06 — End: 2021-07-06
  Administered 2021-07-06: 100 mg via INTRAVENOUS

## 2021-07-06 MED ORDER — ACETAMINOPHEN 10 MG/ML IV SOLN
INTRAVENOUS | Status: AC
  Filled 2021-07-06: qty 100

## 2021-07-06 MED ORDER — DEXMEDETOMIDINE HCL IN NACL 200 MCG/50ML IV SOLN
INTRAVENOUS | Status: DC | PRN
  Administered 2021-07-06: 8 ug via INTRAVENOUS

## 2021-07-06 MED ORDER — ONDANSETRON HCL 4 MG/2ML IJ SOLN
INTRAMUSCULAR | Status: DC | PRN
  Administered 2021-07-06: 4 mg via INTRAVENOUS

## 2021-07-06 MED ORDER — MIDAZOLAM HCL 2 MG/2ML IJ SOLN
INTRAMUSCULAR | Status: AC
  Filled 2021-07-06: qty 2

## 2021-07-06 MED ORDER — OXYCODONE HCL 5 MG PO TABS
5.0000 mg | ORAL_TABLET | ORAL | Status: DC | PRN
  Administered 2021-07-07: 5 mg via ORAL
  Filled 2021-07-06: qty 1

## 2021-07-06 MED ORDER — BUPIVACAINE-EPINEPHRINE (PF) 0.25% -1:200000 IJ SOLN
INTRAMUSCULAR | Status: AC
  Filled 2021-07-06: qty 30

## 2021-07-06 SURGICAL SUPPLY — 42 items
ADH SKN CLS APL DERMABOND .7 (GAUZE/BANDAGES/DRESSINGS) ×1
APPLIER CLIP 5 13 M/L LIGAMAX5 (MISCELLANEOUS) ×2
APR CLP MED LRG 5 ANG JAW (MISCELLANEOUS) ×1
BAG RETRIEVAL 10 (BASKET) ×1
BLADE CLIPPER SURG (BLADE) ×2 IMPLANT
CLIP APPLIE 5 13 M/L LIGAMAX5 (MISCELLANEOUS) ×1 IMPLANT
CUTTER FLEX LINEAR 45M (STAPLE) ×2 IMPLANT
DERMABOND ADVANCED (GAUZE/BANDAGES/DRESSINGS) ×1
DERMABOND ADVANCED .7 DNX12 (GAUZE/BANDAGES/DRESSINGS) ×1 IMPLANT
ELECT CAUTERY BLADE 6.4 (BLADE) ×2 IMPLANT
ELECT REM PT RETURN 9FT ADLT (ELECTROSURGICAL) ×2
ELECTRODE REM PT RTRN 9FT ADLT (ELECTROSURGICAL) ×1 IMPLANT
GLOVE BIO SURGEON STRL SZ7 (GLOVE) ×2 IMPLANT
GOWN STRL REUS W/ TWL LRG LVL3 (GOWN DISPOSABLE) ×2 IMPLANT
GOWN STRL REUS W/TWL LRG LVL3 (GOWN DISPOSABLE) ×4
IRRIGATION STRYKERFLOW (MISCELLANEOUS) ×1 IMPLANT
IRRIGATOR STRYKERFLOW (MISCELLANEOUS) ×2
IV NS 1000ML (IV SOLUTION) ×2
IV NS 1000ML BAXH (IV SOLUTION) ×1 IMPLANT
MANIFOLD NEPTUNE II (INSTRUMENTS) ×2 IMPLANT
NEEDLE HYPO 22GX1.5 SAFETY (NEEDLE) ×2 IMPLANT
NS IRRIG 500ML POUR BTL (IV SOLUTION) ×2 IMPLANT
PACK LAP CHOLECYSTECTOMY (MISCELLANEOUS) ×2 IMPLANT
PENCIL ELECTRO HAND CTR (MISCELLANEOUS) ×2 IMPLANT
RELOAD 45 VASCULAR/THIN (ENDOMECHANICALS) ×2 IMPLANT
RELOAD STAPLE 45 2.5 WHT GRN (ENDOMECHANICALS) ×1 IMPLANT
RELOAD STAPLE 45 3.5 BLU ETS (ENDOMECHANICALS) ×1 IMPLANT
RELOAD STAPLE TA45 3.5 REG BLU (ENDOMECHANICALS) ×2 IMPLANT
SCISSORS METZENBAUM CVD 33 (INSTRUMENTS) IMPLANT
SHEARS HARMONIC ACE PLUS 36CM (ENDOMECHANICALS) ×2 IMPLANT
SLEEVE ENDOPATH XCEL 5M (ENDOMECHANICALS) ×2 IMPLANT
SPONGE T-LAP 18X18 ~~LOC~~+RFID (SPONGE) ×2 IMPLANT
SUT MNCRL AB 4-0 PS2 18 (SUTURE) ×3 IMPLANT
SUT VICRYL 0 AB UR-6 (SUTURE) ×4 IMPLANT
SYR 20ML LL LF (SYRINGE) ×2 IMPLANT
SYS BAG RETRIEVAL 10MM (BASKET) ×1
SYSTEM BAG RETRIEVAL 10MM (BASKET) ×1 IMPLANT
TRAY FOLEY MTR SLVR 16FR STAT (SET/KITS/TRAYS/PACK) ×2 IMPLANT
TROCAR XCEL BLUNT TIP 100MML (ENDOMECHANICALS) ×2 IMPLANT
TROCAR XCEL NON-BLD 5MMX100MML (ENDOMECHANICALS) ×2 IMPLANT
TUBING EVAC SMOKE HEATED PNEUM (TUBING) ×2 IMPLANT
WATER STERILE IRR 500ML POUR (IV SOLUTION) ×2 IMPLANT

## 2021-07-06 NOTE — Anesthesia Postprocedure Evaluation (Signed)
Anesthesia Post Note ? ?Patient: Samantha Davies ? ?Procedure(s) Performed: APPENDECTOMY LAPAROSCOPIC (Abdomen) ? ?Patient location during evaluation: PACU ?Anesthesia Type: General ?Level of consciousness: awake and alert ?Pain management: pain level controlled ?Vital Signs Assessment: post-procedure vital signs reviewed and stable ?Respiratory status: spontaneous breathing, nonlabored ventilation, respiratory function stable and patient connected to nasal cannula oxygen ?Cardiovascular status: blood pressure returned to baseline and stable ?Postop Assessment: no apparent nausea or vomiting ?Anesthetic complications: no ? ? ?No notable events documented. ? ? ?Last Vitals:  ?Vitals:  ? 07/06/21 1830 07/06/21 1845  ?BP: 99/70 91/61  ?Pulse:  92  ?Resp:  18  ?Temp: (!) 38.2 ?C 37.2 ?C  ?SpO2:  98%  ?  ?Last Pain:  ?Vitals:  ? 07/06/21 1845  ?TempSrc: Oral  ?PainSc: 0-No pain  ? ? ?  ?  ?  ?  ?  ?  ? ?Precious Haws Saraiya Kozma ? ? ? ? ?

## 2021-07-06 NOTE — Anesthesia Procedure Notes (Signed)
Procedure Name: Intubation ?Date/Time: 07/06/2021 5:10 PM ?Performed by: Nelda Marseille, CRNA ?Pre-anesthesia Checklist: Patient identified, Patient being monitored, Timeout performed, Emergency Drugs available and Suction available ?Patient Re-evaluated:Patient Re-evaluated prior to induction ?Oxygen Delivery Method: Circle system utilized ?Preoxygenation: Pre-oxygenation with 100% oxygen ?Induction Type: IV induction ?Ventilation: Mask ventilation without difficulty ?Laryngoscope Size: Mac, 3 and McGraph ?Grade View: Grade I ?Tube type: Oral ?Tube size: 7.0 mm ?Number of attempts: 1 ?Airway Equipment and Method: Stylet ?Placement Confirmation: ETT inserted through vocal cords under direct vision, positive ETCO2 and breath sounds checked- equal and bilateral ?Secured at: 21 cm ?Tube secured with: Tape ?Dental Injury: Teeth and Oropharynx as per pre-operative assessment  ? ? ? ? ?

## 2021-07-06 NOTE — ED Provider Notes (Signed)
? ?[facility] ?Provider Note ? ? ? Event Date/Time  ? First MD Initiated Contact with Patient 07/06/21 1220   ?  (approximate) ? ? ?History  ? ?Abdominal Pain ? ? ?HPI ? ?Samantha Davies is a 26 y.o. female with significant past medical history presents to the ER for evaluation of 48 hours of progressively worsening right lower quadrant pain associate with nausea and anorexia.  States that she did have a foul odor in her urine but denies any dysuria no discharge.  Does not think that she is pregnant.  No diarrhea.  No recent antibiotics.  No history of kidney stones no previous abdominal surgeries.  Rates the pain is mild to moderate. ?  ? ? ?Physical Exam  ? ?Triage Vital Signs: ?ED Triage Vitals [07/06/21 1221]  ?Enc Vitals Group  ?   BP   ?   Pulse   ?   Resp   ?   Temp   ?   Temp src   ?   SpO2   ?   Weight 115 lb 1.3 oz (52.2 kg)  ?   Height '5\' 1"'$  (1.549 m)  ?   Head Circumference   ?   Peak Flow   ?   Pain Score   ?   Pain Loc   ?   Pain Edu?   ?   Excl. in [place]?   ? ? ?Most recent vital signs: ?Vitals:  ? 07/06/21 1259 07/06/21 1429  ?BP: 97/66 107/78  ?Pulse: 100 99  ?Resp: 18 18  ?Temp: (!) 100.4 ?F (38 ?C)   ?SpO2: 95% 95%  ? ? ? ?Constitutional: Alert  ?Eyes: Conjunctivae are normal.  ?Head: Atraumatic. ?Nose: No congestion/rhinnorhea. ?Mouth/Throat: Mucous membranes are moist.   ?Neck: Painless ROM.  ?Cardiovascular:   Good peripheral circulation. ?Respiratory: Normal respiratory effort.  No retractions.  ?Gastrointestinal: Soft with ttp in rlq ?Musculoskeletal:  no deformity ?Neurologic:  MAE spontaneously. No gross focal neurologic deficits are appreciated.  ?Skin:  Skin is warm, dry and intact. No rash noted. ?Psychiatric: Mood and affect are normal. Speech and behavior are normal. ? ? ? ?ED Results / Procedures / Treatments  ? ?Labs ?(all labs ordered are listed, but only abnormal results are displayed) ?Labs Reviewed  ?CBC - Abnormal; Notable for the following components:  ?     Result Value  ? Hemoglobin 11.2 (*)   ? HCT 32.9 (*)   ? All other components within normal limits  ?COMPREHENSIVE METABOLIC PANEL - Abnormal; Notable for the following components:  ? Potassium 3.3 (*)   ? Total Protein 8.3 (*)   ? AST 43 (*)   ? All other components within normal limits  ?URINALYSIS, ROUTINE W REFLEX MICROSCOPIC - Abnormal; Notable for the following components:  ? Color, Urine YELLOW (*)   ? APPearance HAZY (*)   ? Hgb urine dipstick MODERATE (*)   ? Ketones, ur 5 (*)   ? Protein, ur 30 (*)   ? Nitrite POSITIVE (*)   ? Leukocytes,Ua SMALL (*)   ? Bacteria, UA MANY (*)   ? All other components within normal limits  ?LIPASE, BLOOD  ?PREGNANCY, URINE  ?POC URINE PREG, ED  ? ? ? ?EKG ? ? ? ? ?RADIOLOGY ?Please see ED Course for my review and interpretation. ? ?I personally reviewed all radiographic images ordered to evaluate for the above acute complaints and reviewed radiology reports and findings.  These findings were personally discussed with the  patient.  Please see medical record for radiology report. ? ? ? ?PROCEDURES: ? ?Critical Care performed: No ? ?Procedures ? ? ?MEDICATIONS ORDERED IN ED: ?Medications  ?morphine (PF) 4 MG/ML injection 4 mg (4 mg Intravenous Given 07/06/21 1250)  ?sodium chloride 0.9 % bolus 1,000 mL (0 mLs Intravenous Stopped 07/06/21 1421)  ?ondansetron (ZOFRAN) injection 4 mg (4 mg Intravenous Given 07/06/21 1249)  ?sodium chloride 0.9 % bolus 1,000 mL (1,000 mLs Intravenous New Bag/Given 07/06/21 1420)  ?cefTRIAXone (ROCEPHIN) 2 g in sodium chloride 0.9 % 100 mL IVPB (0 g Intravenous Stopped 07/06/21 1421)  ?iohexol (OMNIPAQUE) 300 MG/ML solution 80 mL (80 mLs Intravenous Contrast Given 07/06/21 1432)  ? ? ? ?IMPRESSION / MDM / ASSESSMENT AND PLAN / ED COURSE  ?I reviewed the triage vital signs and the nursing notes. ?             ?               ? ?Differential diagnosis includes, but is not limited to, appendicitis, pyelonephritis, PID, colitis, cystitis, musculoskeletal  strain, stone ? ?Patient presented to ER with right lower quadrant pain as described above she is febrile nontoxic-appearing we will give IV fluids we will order blood work for the blood differential will order CT imaging.  Have lower suspicion for PID given reported no vaginal discharge or bleeding.  CV primarily right lower quadrant. ? ? ?Clinical Course as of 07/06/21 1529  ?Tue Jul 06, 2021  ?1349 Urinalysis is consistent with UTI.  Lipase negative.  Will give additional IV fluids awaiting CT imaging [place] IV antibiotics. [PR]  ?1528 Repeat abdominal exam patient does have tenderness in the right lower quadrant.  This may be related to cystitis pyelonephritis but given concern for possible early mild appendicitis based on CT I have consulted with Dr. Dahlia Byes of general surgery who will evaluate patient at bedside. ?Signed out to oncoming physician pending surgery consultation. [PR]  ?  ?Clinical Course User Index ?[PR] Merlyn Lot, MD  ? ? ? ?FINAL CLINICAL IMPRESSION(S) / ED DIAGNOSES  ? ?Final diagnoses:  ?RLQ abdominal pain  ? ? ? ?Rx / DC Orders  ? ?ED Discharge Orders   ? ? None  ? ?  ? ? ? ?Note:  This document was prepared using Dragon voice recognition software and may include unintentional dictation errors. ? ?  ?Merlyn Lot, MD ?07/06/21 1530 ? ?

## 2021-07-06 NOTE — Op Note (Signed)
laparascopic appendectomy  ? ?[name] ?Date of operation:  07/06/2021 ? ?Indications: The patient presented with a history of  abdominal pain. Workup has revealed findings consistent with acute appendicitis. ? ?Pre-operative Diagnosis: Acute appendicitis without mention of peritonitis ? ?Post-operative Diagnosis: Same ? ?Surgeon: Caroleen Hamman, MD, FACS ? ?Anesthesia: General with endotracheal tube ? ?Findings: Acute appendicitis , no evidence of PID ? ?Estimated Blood Loss: 5cc ?        ?Specimens: appendix ?        ?Complications:  none ? ?Procedure Details  ?The patient was seen again in the preop area. The options of surgery versus observation were reviewed with the patient and/or family. The risks of bleeding, infection, recurrence of symptoms, negative laparoscopy, potential for an open procedure, bowel injury, abscess or infection, were all reviewed as well. The patient was taken to Operating Room, identified as [name] and the procedure verified as laparoscopic appendectomy. A Time Out was held and the above information confirmed. ? ?The patient was placed in the supine position and general anesthesia was induced.  Antibiotic prophylaxis was administered and VT E prophylaxis was in place.  ?The abdomen was prepped and draped in a sterile fashion. An infraumbilical incision was made. A cutdown technique was used to enter the abdominal cavity. Two vicryl stitches were placed on the fascia and a Hasson trocar inserted. Pneumoperitoneum obtained. Two 5 mm ports were placed under direct visualization. ? ? The appendix was identified and found to be acutely inflamed . Pelvic organs , tubes, ovaries showed no evidence of an infectious process. ?The appendix was carefully dissected. The mesoappendix was divided withHarmonic scalpel. ?The base of the appendix was dissected out and divided with a standard load Endo GIA.The appendix was placed in a Endo Catch bag and removed via the Hasson port. The right  lower quadrant and pelvis was then irrigated with  normal saline which was aspirated. Inspection  failed to identify any additional bleeding and there were no signs of bowel injury. Again the right lower quadrant was inspected there was no sign of bleeding or bowel injury therefore pneumoperitoneum was released, all ports were removed. ? ?The umbilical fascia was closed with 0 Vicryl interrupted sutures and the skin incisions were approximated with subcuticular 4-0 Monocryl. Dermabond was placed ?The patient tolerated the procedure well, there were no complications. The sponge lap and needle count were correct at the end of the procedure. ? ?The patient was taken to the recovery room in stable condition to be admitted for continued care. ? ? ? Caroleen Hamman, MD FACS  ?

## 2021-07-06 NOTE — ED Triage Notes (Signed)
Pt c/o abd pain for the past 3 days with N/V. States "I think my appendix has ruptured" ?

## 2021-07-06 NOTE — Transfer of Care (Signed)
Immediate Anesthesia Transfer of Care Note ? ?Patient: Samantha Davies ? ?Procedure(s) Performed: APPENDECTOMY LAPAROSCOPIC ? ?Patient Location: PACU ? ?Anesthesia Type:General ? ?Level of Consciousness: sedated ? ?Airway & Oxygen Therapy: Patient Spontanous Breathing and Patient connected to face mask oxygen ? ?Post-op Assessment: Report given to RN and Post -op Vital signs reviewed and stable ? ?Post vital signs: Reviewed and stable ? ?Last Vitals:  ?Vitals Value Taken Time  ?BP    ?Temp    ?Pulse    ?Resp    ?SpO2    ? ? ?Last Pain:  ?Vitals:  ? 07/06/21 1623  ?TempSrc: Oral  ?PainSc:   ?   ? ?  ? ?Complications: No notable events documented. ?

## 2021-07-06 NOTE — H&P (Signed)
Patient ID: Samantha Davies, female   DOB: 1995-08-07, 26 y.o.   MRN: [id number] ? ?HPI ?Samantha Davies is a 26 y.o. female seen at the request of Dr. Quentin Cornwall.  She endorses being 42 to 72-hour history of abdominal pain located in the right lower quadrant it is moderate to severe intensity and sharp.  Worsening with movement.  She doe she also has some dysuria.  She does have a history of lupus and is on plaquenil and prednisone.  She does have associated decreased appetite and nausea. ?She is a Radio producer. ?No prior history of abdominal operations in the past she is able to perform more than 4 METS of activity without any shortness of breath or chest pain. ?She did have a CT scan of the abdomen pelvis that have personally reviewed showing evidence of a dilated appendix and some mild inflammatory changes.  No evidence of perforation no evidence of abscess no evidence of free air.  She does have anemia with a hemoglobin of 11.2 white count is normal, CMP is normal. ?She denies any vaginal discharge or symptoms consistent with PID.  She does have a UTI as well. ? ?HPI ? ?Past Medical History:  ?Diagnosis Date  ? Alopecia   ? Anxiety   ? Depression   ? Lupus ([place]) 2005  ? ? ?Past Surgical History:  ?Procedure Laterality Date  ? broken finger Left 06/2017  ? ? ?Family History  ?Problem Relation Age of Onset  ? Lupus Father   ? Kidney failure Father   ?     associated with Lupus  ? Anxiety disorder Mother   ? Mood Disorder Mother   ? Aneurysm Paternal Grandmother   ? Alcohol abuse Paternal Grandmother   ? Breast cancer Maternal Grandmother   ? Cancer Paternal Grandfather   ?     unsure type  ? ? ?Social History ?Social History  ? ?Tobacco Use  ? Smoking status: Never  ? Smokeless tobacco: Never  ? Tobacco comments:  ?  smokes marijuana daily  ?Vaping Use  ? Vaping Use: Never used  ?Substance Use Topics  ? Alcohol use: Yes  ?  Comment: 1 drink per month  ? Drug use: Yes  ?  Frequency: 7.0 times per week  ?  Types:  Marijuana  ? ? ?Allergies  ?Allergen Reactions  ? Flagyl [Metronidazole]   ?  Difficulty swallowing this large pill. ?Able to use Metrogel without allergic reaction.   ? ? ?Current Facility-Administered Medications  ?Medication Dose Route Frequency Provider Last Rate Last Admin  ? morphine (PF) 4 MG/ML injection 4 mg  4 mg Intravenous Q3H PRN Merlyn Lot, MD   4 mg at 07/06/21 1250  ? ?Current Outpatient Medications  ?Medication Sig Dispense Refill  ? Belimumab (BENLYSTA) 200 MG/ML SOAJ Inject 1 Dose into the skin once a week.    ? Fluocinolone Acetonide Scalp 0.01 % OIL APP EXT AA BID FOR 14 DAYS  2  ? FLUoxetine (PROZAC) 20 MG capsule Take 20 mg by mouth daily.    ? hydroxychloroquine (PLAQUENIL) 200 MG tablet TAKE 1 1/2 TABLETS BY MOUTH DAILY FOR LUPUS    ? Levonorgestrel (KYLEENA) 19.5 MG IUD by Intrauterine route.    ? ondansetron (ZOFRAN-ODT) 8 MG disintegrating tablet SMARTSIG:1 Tablet(s) Sublingual PRN    ? predniSONE (STERAPRED UNI-PAK 21 TAB) 10 MG (21) TBPK tablet 6,5,4,3,2,1 21 tablet 0  ? ? ? ?Review of Systems ?Full ROS  was asked and was negative except for the information  on the HPI ? ?Physical Exam ?Blood pressure 107/78, pulse 99, temperature (!) 100.4 ?F (38 ?C), temperature source Oral, resp. rate 18, height '5\' 1"'$  (1.549 m), weight 52.2 kg, SpO2 95 %. ?CONSTITUTIONAL: NAD, loqw grade temp ?EYES: Pupils are equal, round,  Sclera are non-icteric. ?EARS, NOSE, MOUTH AND THROAT: . The oral mucosa is pink and moist. Hearing is intact to voice. ?LYMPH NODES:  Lymph nodes in the neck are normal. ?RESPIRATORY:  Lungs are clear. There is normal respiratory effort, with equal breath sounds bilaterally, and without pathologic use of accessory muscles. ?CARDIOVASCULAR: Heart is regular without murmurs, gallops, or rubs. ?GI: The abdomen is  soft, tender to palpation in the right lower quadrant with focal peritonitis.  There are no palpable masses. There is no hepatosplenomegaly. There are normal bowel  sounds ?GU: Rectal deferred.   ?MUSCULOSKELETAL: Normal muscle strength and tone. No cyanosis or edema.   ?SKIN: Turgor is good and there are no pathologic skin lesions or ulcers. ?NEUROLOGIC: Motor and sensation is grossly normal. Cranial nerves are grossly intact. ?PSYCH:  Oriented to person, place and time. Affect is normal. ? ?Data Reviewed ? ?I have personally reviewed the patient's imaging, laboratory findings and medical records.   ? ?Assessment/Plan ?26 year old female with right lower quadrant pain clinically consistent with appendicitis with CT scan also concerning for appendicitis.  In addition to this she does have an active UTI.  Discussed with patient in detail about her disease process.  I am concerned about the fact that she is on chronic steroids and antibiotics alone will not be the right treatment for her.  I do recommend diagnostic laparoscopy and appendectomy.  Procedure discussed with the patient detail.  Risks, benefits and possible implications including but not limited to: Bleeding, infection conversion to open, stump leak and poor wound healing. ?Please note I spent 75 minutes in this encounter including coordination of her care, personally reviewing imaging studies, counseling the patient, placing orders and performing appropriate documentation.  Plan to proceed to the OR tonight for laparoscopic appendectomy. Antibiotics and crystalloids started. ? ?Caroleen Hamman, MD FACS ?General Surgeon ?07/06/2021, 3:48 PM ? ?  ?

## 2021-07-06 NOTE — ED Notes (Signed)
Provider at bedside consent signed  ?

## 2021-07-06 NOTE — Anesthesia Preprocedure Evaluation (Signed)
Anesthesia Evaluation  ?Patient identified by MRN, date of birth, ID band ?Patient awake ? ? ? ?Reviewed: ?Allergy & Precautions, NPO status , Patient's Chart, lab work & pertinent test results ? ?History of Anesthesia Complications ?Negative for: history of anesthetic complications ? ?Airway ?Mallampati: II ? ?TM Distance: >3 FB ?Neck ROM: full ? ? ? Dental ? ?(+) Chipped, Poor Dentition ?  ?Pulmonary ?neg pulmonary ROS, neg shortness of breath,  ?  ?Pulmonary exam normal ? ? ? ? ? ? ? Cardiovascular ?Exercise Tolerance: Good ?(-) angina(-) Past MI negative cardio ROS ?Normal cardiovascular exam ? ? ?  ?Neuro/Psych ? Headaches, negative psych ROS  ? GI/Hepatic ?negative GI ROS, Neg liver ROS, neg GERD  ,  ?Endo/Other  ?negative endocrine ROS ? Renal/GU ?  ? ?  ?Musculoskeletal ? ? Abdominal ?  ?Peds ? Hematology ?negative hematology ROS ?(+)   ?Anesthesia Other Findings ?Past Medical History: ?No date: Alopecia ?No date: Anxiety ?No date: Depression ?2005: Lupus ([place]) ? ?Past Surgical History: ?06/2017: broken finger; Left ? ?BMI   ? Body Mass Index: 21.74 kg/m?  ?  ? ? Reproductive/Obstetrics ?negative OB ROS ? ?  ? ? ? ? ? ? ? ? ? ? ? ? ? ?  ?  ? ? ? ? ? ? ? ? ?Anesthesia Physical ?Anesthesia Plan ? ?ASA: 3 ? ?Anesthesia Plan: General ETT  ? ?Post-op Pain Management:   ? ?Induction: Intravenous ? ?PONV Risk Score and Plan: Ondansetron, Dexamethasone, Midazolam and Treatment may vary due to age or medical condition ? ?Airway Management Planned: Oral ETT ? ?Additional Equipment:  ? ?Intra-op Plan:  ? ?Post-operative Plan: Extubation in OR ? ?Informed Consent: I have reviewed the patients History and Physical, chart, labs and discussed the procedure including the risks, benefits and alternatives for the proposed anesthesia with the patient or authorized representative who has indicated his/her understanding and acceptance.  ? ? ? ?Dental Advisory Given ? ?Plan Discussed with:  Anesthesiologist, CRNA and Surgeon ? ?Anesthesia Plan Comments: (Patient consented for risks of anesthesia including but not limited to:  ?- adverse reactions to medications ?- damage to eyes, teeth, lips or other oral mucosa ?- nerve damage due to positioning  ?- sore throat or hoarseness ?- Damage to heart, brain, nerves, lungs, other parts of body or loss of life ? ?Patient voiced understanding.)  ? ? ? ? ? ? ?Anesthesia Quick Evaluation ? ?

## 2021-07-07 ENCOUNTER — Encounter: Payer: Self-pay | Admitting: Surgery

## 2021-07-07 LAB — CBC
HCT: 30.9 % — ABNORMAL LOW (ref 36.0–46.0)
Hemoglobin: 10.6 g/dL — ABNORMAL LOW (ref 12.0–15.0)
MCH: 28.3 pg (ref 26.0–34.0)
MCHC: 34.3 g/dL (ref 30.0–36.0)
MCV: 82.6 fL (ref 80.0–100.0)
Platelets: 138 10*3/uL — ABNORMAL LOW (ref 150–400)
RBC: 3.74 MIL/uL — ABNORMAL LOW (ref 3.87–5.11)
RDW: 14.2 % (ref 11.5–15.5)
WBC: 8 10*3/uL (ref 4.0–10.5)
nRBC: 0 % (ref 0.0–0.2)

## 2021-07-07 LAB — BASIC METABOLIC PANEL
Anion gap: 6 (ref 5–15)
BUN: 8 mg/dL (ref 6–20)
CO2: 24 mmol/L (ref 22–32)
Calcium: 8.2 mg/dL — ABNORMAL LOW (ref 8.9–10.3)
Chloride: 109 mmol/L (ref 98–111)
Creatinine, Ser: 0.73 mg/dL (ref 0.44–1.00)
GFR, Estimated: >60 mL/min (ref >60)
Glucose, Bld: 118 mg/dL — ABNORMAL HIGH (ref 70–99)
Potassium: 3.6 mmol/L (ref 3.5–5.1)
Sodium: 139 mmol/L (ref 135–145)

## 2021-07-07 LAB — HIV ANTIBODY (ROUTINE TESTING W REFLEX): HIV Screen 4th Generation wRfx: NONREACTIVE

## 2021-07-07 MED ORDER — IBUPROFEN 600 MG PO TABS: 600.0000 mg | ORAL_TABLET | Freq: Four times a day (QID) | ORAL | 0 refills | Status: AC | PRN

## 2021-07-07 MED ORDER — SULFAMETHOXAZOLE-TRIMETHOPRIM 800-160 MG PO TABS: 1.0000 | ORAL_TABLET | Freq: Two times a day (BID) | ORAL | 0 refills | Status: AC

## 2021-07-07 MED ORDER — OXYCODONE HCL 5 MG PO TABS
5.0000 mg | ORAL_TABLET | Freq: Four times a day (QID) | ORAL | 0 refills | Status: DC | PRN
Start: 2021-07-07 — End: 2021-07-13

## 2021-07-07 MED ORDER — OXYCODONE HCL 5 MG PO TABS
5.0000 mg | ORAL_TABLET | Freq: Four times a day (QID) | ORAL | 0 refills | Status: DC | PRN
Start: 2021-07-07 — End: 2021-07-07

## 2021-07-07 MED ORDER — SULFAMETHOXAZOLE-TRIMETHOPRIM 800-160 MG PO TABS: 1.0000 | ORAL_TABLET | Freq: Two times a day (BID) | ORAL | 0 refills | Status: DC

## 2021-07-07 MED ORDER — IBUPROFEN 600 MG PO TABS
600.0000 mg | ORAL_TABLET | Freq: Four times a day (QID) | ORAL | 0 refills | Status: DC | PRN
Start: 2021-07-07 — End: 2021-07-07

## 2021-07-07 NOTE — Progress Notes (Signed)
Order to discharge pt home.  Discharge instructions/AVS given to patient and reviewed - education provided as needed.  Pt advised to call PCP and/or come back to the hospital if there are any problems. Pt verbalized understanding.    

## 2021-07-07 NOTE — TOC Initial Note (Signed)
Transition of Care (TOC) - Initial/Assessment Note  ? ? ?Patient Details  ?Name: Samantha Davies ?MRN: [id number] ?Date of Birth: [birth year] ? ?Transition of Care (TOC) CM/SW Contact:    ?Beverly Sessions, RN ?Phone Number: ?07/07/2021, 9:01 AM ? ?Clinical Narrative:                 ? ?Transition of Care (TOC) Screening Note ? ? ?Patient Details  ?Name: Samantha Davies ?Date of Birth: Dec 13, 1995 ? ? ?Transition of Care (TOC) CM/SW Contact:    ?Beverly Sessions, RN ?Phone Number: ?07/07/2021, 9:01 AM ? ? ? ?Transition of Care Department [facility]) has reviewed patient and no TOC needs have been identified at this time. We will continue to monitor patient advancement through interdisciplinary progression rounds. If new patient transition needs arise, please place a TOC consult. ? ? ? ?  ?  ? ? ?Patient Goals and CMS Choice ?  ?  ?  ? ?Expected Discharge Plan and Services ?  ?  ?  ?  ?  ?                ?  ?  ?  ?  ?  ?  ?  ?  ?  ?  ? ?Prior Living Arrangements/Services ?  ?  ?  ?       ?  ?  ?  ?  ? ?Activities of Daily Living ?Home Assistive Devices/Equipment: None ?ADL Screening (condition at time of admission) ?Patient's cognitive ability adequate to safely complete daily activities?: Yes ?Is the patient deaf or have difficulty hearing?: No ?Does the patient have difficulty seeing, even when wearing glasses/contacts?: No ?Does the patient have difficulty concentrating, remembering, or making decisions?: No ?Patient able to express need for assistance with ADLs?: Yes ?Does the patient have difficulty dressing or bathing?: No ?Independently performs ADLs?: Yes (appropriate for developmental age) ?Does the patient have difficulty walking or climbing stairs?: Yes ?Weakness of Legs: Both ?Weakness of Arms/Hands: None ? ?Permission Sought/Granted ?  ?  ?   ?   ?   ?   ? ?Emotional Assessment ?  ?  ?  ?  ?  ?  ? ?Admission diagnosis:  RLQ abdominal pain [R10.31] ?Appendicitis [K37] ?Patient Active Problem List  ? Diagnosis Date  Noted  ? Appendicitis 07/06/2021  ? H/O urinary tract infection 09/09/2015  ? 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 History of urinary tract infection 05/15/2012  ? COMMON MIGRAINE 09/10/2006  ? Systemic lupus erythematosus ([place]) 09/10/2006  ? ?PCP:  Tracie Harrier, MD ?Pharmacy:   ?[facility], [address] AT [facility] ?[address] ?[place] [address] ?Phone: [phone] Fax: [phone] ? ?[id number] [address] AT [address] ?[address] ?[place] [address] ?Phone: [phone] Fax: [phone] ? ? ? ? ?Social Determinants of Health (SDOH) Interventions ?  ? ?Readmission Risk Interventions ?   ? View : No data to display.  ?  ?  ?  ? ? ? ?

## 2021-07-07 NOTE — Plan of Care (Signed)

## 2021-07-07 NOTE — Discharge Summary (Signed)
[facility] ?SURGICAL DISCHARGE SUMMARY  ? ?Patient ID: ?Samantha Davies ?MRN: [id number] ?DOB/AGE: 08-Sep-1995 26 y.o. ? ?Admit date: 07/06/2021 ?Discharge date: 07/07/2021 ? ?Discharge Diagnoses ?Patient Active Problem List  ? Diagnosis Date Noted  ? Appendicitis 07/06/2021  ? ? ?Consultants ?None ? ?Procedures ?07/06/2021:  ?Laparoscopic Appendectomy  ? ?HPI: Camreigh Porrata is a 26 y.o. female seen at the request of Dr. Quentin Cornwall.  She endorses being 42 to 72-hour history of abdominal pain located in the right lower quadrant it is moderate to severe intensity and sharp.  Worsening with movement. She doe she also has some dysuria.  She does have a history of lupus and is on plaquenil and prednisone.  She does have associated decreased appetite and nausea. 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 She did have a CT scan of the abdomen pelvis that have personally reviewed showing evidence of a dilated appendix and some mild inflammatory changes.  No evidence of perforation no evidence of abscess no evidence of free air.  She does have anemia with a hemoglobin of 11.2 white count is normal, CMP is normal. She denies any vaginal discharge or symptoms consistent with PID.  She does have a UTI as well. ? ?Hospital Course: Informed consent was obtained and documented, and patient underwent uneventful laparoscopic appendectomy (Dr Dahlia Byes, 07/06/2021).  Post-operatively, patient's pain/symptoms improved/resolved and advancement of patient's diet and ambulation were well-tolerated. The remainder of patient's hospital course was essentially unremarkable, and discharge planning was initiated accordingly with patient safely able to be discharged home with appropriate discharge instructions, antibiotics (Bactrim x5 days for UTI), pain control, and outpatient follow-up after all of her  questions were answered to her expressed satisfaction. ? ? ?Discharge Condition: Good ? ? ?Physical Examination:  ?Constitutional: Well appearing female, NAD ?Pulmonary: Normal effort, no respiratory distress ?Gastrointestinal: Soft, incisional soreness, non-distended, no rebound/guarding ?Skin: Laparoscopic incisions are CDI with dermabond, no erythema or drainage  ? ? ?Allergies as of 07/07/2021   ? ?   Reactions  ? Flagyl [metronidazole]   ? Difficulty swallowing this large pill. ?Able to use Metrogel without allergic reaction.   ? ?  ? ?  ?Medication List  ?  ? ?TAKE these medications   ? ?Benlysta 200 MG/ML Soaj ?Generic drug: Belimumab ?Inject 1 Dose into the skin once a week. ?  ?Fluocinolone Acetonide Scalp 0.01 % Oil ?APP EXT AA BID FOR 14 DAYS ?  ?FLUoxetine 20 MG capsule ?Commonly known as: PROZAC ?Take 20 mg by mouth daily. ?  ?hydroxychloroquine 200 MG tablet ?Commonly known as: PLAQUENIL ?TAKE 1 1/2 TABLETS BY MOUTH DAILY FOR LUPUS ?  ?ibuprofen 600 MG tablet ?Commonly known as: ADVIL ?Take 1 tablet (600 mg total) by mouth every 6 (six) hours as needed. ?  ?levonorgestrel 19.5 MG IUD ?Commonly known as: KYLEENA ?by Intrauterine route. ?  ?ondansetron 8 MG disintegrating tablet ?Commonly known as: ZOFRAN-ODT ?SMARTSIG:1 Tablet(s) Sublingual PRN ?  ?oxyCODONE 5 MG immediate release tablet ?Commonly known as: Oxy IR/ROXICODONE ?Take 1 tablet (5 mg total) by mouth every 6 (six) hours as needed for severe pain or breakthrough pain. ?  ?predniSONE 10 MG (21) Tbpk tablet ?Commonly known as: STERAPRED UNI-PAK 21 TAB ?6,5,4,3,2,1 ?  ?sulfamethoxazole-trimethoprim 800-160 MG tablet ?Commonly known as: BACTRIM DS ?Take 1 tablet by mouth 2 (two) times daily for 5 days. ?  ? ?  ? ? ? ?  Follow-up Information   ? ? Tylene Fantasia, PA-C. Schedule an appointment as soon as possible for a visit in 3 week(s).   ?Specialty: Physician Assistant ?Why: s/p laparoscopic appendectomy ?Contact information: ?[address] ?[address] ?[place] [address] ?[phone] ? ? ?  ?  ? ?  ?  ? ?  ? ? ? ?Time spent on discharge management including discussion of hospital course, clinical condition, outpatient instructions, prescriptions, and follow up with the patient and members of the medical team: >30 minutes ? ?-- ?Edison Simon , PA-C ?[facility]  ?07/07/2021, 9:49 AM ?[phone] ?M-F: 7am - 4pm ? ?

## 2021-07-07 NOTE — Discharge Instructions (Signed)
In addition to included general post-operative instructions, ? ?Diet: Resume home diet.  ? ?Activity: No heavy lifting >20 pounds (children, pets, laundry, garbage) for 4 weeks, but light activity and walking are encouraged. Do not drive or drink alcohol if taking narcotic pain medications or having pain that might distract from driving. ? ?Wound care: 2 days after surgery (04/27), you may shower/get incision wet with soapy water and pat dry (do not rub incisions), but no baths or submerging incision underwater until follow-up.  ? ?Medications: Resume all home medications. For mild to moderate pain: acetaminophen (Tylenol) or ibuprofen/naproxen (if no kidney disease). Combining Tylenol with alcohol can substantially increase your risk of causing liver disease. Narcotic pain medications, if prescribed, can be used for severe pain, though may cause nausea, constipation, and drowsiness. Do not combine Tylenol and Percocet (or similar) within a 6 hour period as Percocet (and similar) contain(s) Tylenol. If you do not need the narcotic pain medication, you do not need to fill the prescription. ? ?Call office [phone] / [phone]) at any time if any questions, worsening pain, fevers/chills, bleeding, drainage from incision site, or other concerns. ? ?

## 2021-07-08 LAB — SURGICAL PATHOLOGY

## 2021-07-12 ENCOUNTER — Telehealth: Payer: Self-pay | Admitting: *Deleted

## 2021-07-12 NOTE — Telephone Encounter (Signed)
Patient will come in tomorrow to be seen by [facility].  ?

## 2021-07-12 NOTE — Telephone Encounter (Signed)
Patient called and wanted to see if she can get another refill on oxycodone ([facility] [address]). She had surgery on 07/06/21 appendectomy Dr Dahlia Byes ?

## 2021-07-13 ENCOUNTER — Encounter: Payer: Self-pay | Admitting: Physician Assistant

## 2021-07-13 ENCOUNTER — Other Ambulatory Visit: Payer: Self-pay

## 2021-07-13 ENCOUNTER — Ambulatory Visit (INDEPENDENT_AMBULATORY_CARE_PROVIDER_SITE_OTHER): Payer: BC Managed Care – PPO | Admitting: Physician Assistant

## 2021-07-13 VITALS — BP 92/62 | HR 80 | Temp 98.3°F | Ht 61.0 in | Wt 111.2 lb

## 2021-07-13 DIAGNOSIS — L930 Discoid lupus erythematosus: Secondary | ICD-10-CM | POA: Insufficient documentation

## 2021-07-13 DIAGNOSIS — Z09 Encounter for follow-up examination after completed treatment for conditions other than malignant neoplasm: Secondary | ICD-10-CM

## 2021-07-13 DIAGNOSIS — L659 Nonscarring hair loss, unspecified: Secondary | ICD-10-CM | POA: Insufficient documentation

## 2021-07-13 DIAGNOSIS — L639 Alopecia areata, unspecified: Secondary | ICD-10-CM | POA: Insufficient documentation

## 2021-07-13 DIAGNOSIS — K353 Acute appendicitis with localized peritonitis, without perforation or gangrene: Secondary | ICD-10-CM

## 2021-07-13 MED ORDER — OXYCODONE HCL 5 MG PO TABS: 5.0000 mg | ORAL_TABLET | Freq: Four times a day (QID) | ORAL | 0 refills | Status: DC | PRN

## 2021-07-13 MED ORDER — CYCLOBENZAPRINE HCL 5 MG PO TABS: 5.0000 mg | ORAL_TABLET | Freq: Every day | ORAL | 0 refills | Status: DC

## 2021-07-13 NOTE — Progress Notes (Signed)
[facility] ?POST-OP OFFICE VISIT ? ?07/13/2021 ? ?HPI: ?Samantha Davies is a 26 y.o. female 7 days s/p laparoscopic appendectomy for acute appendicitis with Dr Dahlia Byes  ? ?She continues to have incisional and generalized abdominal soreness. She believes this is a muscle pain and tends to be worse at the end of the day when she tries to sleep. She is using her post-operative medications as prescribed and trying ice pack.  ?No fever, chills, nausea, emesis ?No issues with incisions ?No other complaints  ? ?Vital signs: ?BP 92/62   Pulse 80   Temp 98.3 ?F (36.8 ?C) (Oral)   Ht '5\' 1"'$  (1.549 m)   Wt 111 lb 3.2 oz (50.4 kg)   SpO2 99%   BMI 21.01 kg/m?   ? ?Physical Exam: ?Constitutional: Well appearing female, NAD ?Abdomen: Soft, incisional soreness, non-distended, no rebound/guarding ?Skin: Laparoscopic incisions are healing well, no erythema or drainage  ? ?Assessment/Plan: ?This is a 26 y.o. female 7 days s/p laparoscopic appendectomy for acute appendicitis ? ? - Will give short refill of oxycodone (10 pills); add flexeril QHS (10 pills); continue OTC medications and ice packs.  ? - Reviewed wound care recommendation ? - Reviewed lifting restrictions; 4 weeks total ? - Reviewed surgical pathology; Acute Appendicitis  ? - She will follow up on 05/16 ? ?-- ?Edison Simon, PA-C ?[facility] ?07/13/2021, 2:43 PM ?M-F: 7am - 4pm ? ?

## 2021-07-13 NOTE — Patient Instructions (Signed)

## 2021-07-27 ENCOUNTER — Encounter: Payer: BC Managed Care – PPO | Admitting: Physician Assistant

## 2021-07-29 ENCOUNTER — Encounter: Payer: Self-pay | Admitting: Physician Assistant

## 2021-07-29 ENCOUNTER — Ambulatory Visit (INDEPENDENT_AMBULATORY_CARE_PROVIDER_SITE_OTHER): Payer: BC Managed Care – PPO | Admitting: Physician Assistant

## 2021-07-29 VITALS — BP 95/67 | HR 91 | Temp 98.3°F | Ht 61.0 in | Wt 114.0 lb

## 2021-07-29 DIAGNOSIS — K353 Acute appendicitis with localized peritonitis, without perforation or gangrene: Secondary | ICD-10-CM

## 2021-07-29 DIAGNOSIS — Z09 Encounter for follow-up examination after completed treatment for conditions other than malignant neoplasm: Secondary | ICD-10-CM

## 2021-07-29 NOTE — Patient Instructions (Signed)

## 2021-07-29 NOTE — Progress Notes (Signed)
[facility] POST-OP OFFICE VISIT  07/29/2021  HPI: Samantha Davies is a 26 y.o. female 23 days s/p laparoscopic appendectomy for acute appendicitis with Dr Dahlia Byes   She is doing much better Her abdominal pain is resolved aside from a few twinges of pain in the RLQ with BMs but she attributes this to "eating too much" sometimes No fever, chills, nausea, emesis No issues with her incisions No other complaints   Vital signs: BP 95/67   Pulse 91   Temp 98.3 F (36.8 C)   Ht '5\' 1"'$  (1.549 m)   Wt 114 lb (51.7 kg)   LMP 07/21/2021 (Exact Date)   SpO2 98%   BMI 21.54 kg/m    Physical Exam: Constitutional: Well appearing female, NAD Abdomen: Soft, non-tender, non-distended, no rebound/guarding Skin: Laparoscopic incisions are healing well, no erythema or drainage   Assessment/Plan: This is a 26 y.o. female 23 days s/p laparoscopic appendectomy for acute appendicitis   - Pain control prn  - Reviewed wound care recommendation  - Reviewed lifting restrictions; she will complete these at the end of the week   - She can follow up on as needed basis; She understands to call with questions/concerns  -- Edison Simon, PA-C [facility] 07/29/2021, 3:57 PM M-F: 7am - 4pm

## 2021-09-02 ENCOUNTER — Ambulatory Visit (INDEPENDENT_AMBULATORY_CARE_PROVIDER_SITE_OTHER): Payer: BC Managed Care – PPO | Admitting: Dermatology

## 2021-09-02 DIAGNOSIS — M3219 Other organ or system involvement in systemic lupus erythematosus: Secondary | ICD-10-CM | POA: Diagnosis not present

## 2021-09-02 DIAGNOSIS — D485 Neoplasm of uncertain behavior of skin: Secondary | ICD-10-CM | POA: Diagnosis not present

## 2021-09-02 DIAGNOSIS — L930 Discoid lupus erythematosus: Secondary | ICD-10-CM | POA: Diagnosis not present

## 2021-09-02 MED ORDER — CLOBETASOL PROPIONATE 0.05 % EX CREA: TOPICAL_CREAM | CUTANEOUS | 2 refills | Status: DC

## 2021-09-02 NOTE — Progress Notes (Unsigned)
   New Patient Visit  Subjective  Samantha Davies is a 26 y.o. female who presents for the following: Lupus (New patient referred here today by Rheumatology for rash associated with lupus. Patient advises the rash has been present for about 1 year at arms, back and hip area. ).  Rheumatology notes from 07/01/21 state they will refer to dermatology for biopsy to rule out micro thrombi and comanagement of skin lupus.  The following portions of the chart were reviewed this encounter and updated as appropriate:       Review of Systems:  No other skin or systemic complaints except as noted in HPI or Assessment and Plan.  Objective  Well appearing patient in no apparent distress; mood and affect are within normal limits.  A focused examination was performed including arms, face. Relevant physical exam findings are noted in the Assessment and Plan.  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 Avoid applying to face, groin, and axilla. Use as directed. Long-term use can cause thinning of the skin.  Topical steroids (such as triamcinolone, fluocinolone, fluocinonide, mometasone, clobetasol, halobetasol, betamethasone, hydrocortisone) can cause thinning and lightening of the skin if they are used for too long in the same area. Your physician has selected the right strength medicine for your problem and area affected on the body. Please use your medication only as directed by your physician to prevent side effects.   Continue plaquenil as prescribed by Dr. Posey Pronto  clobetasol cream (TEMOVATE) 0.05 % - forearms, scalp Apply 1 -2 times daily  to affected areas up to 5 days a week. Avoid applying to face, groin, and axilla. Use as directed. Long-term use can cause thinning of the skin.  Neoplasm of uncertain behavior of skin (2) Right Forearm  Right Forearm   Return in about 1 week (around 09/09/2021) for Suture Removal.  Samantha Davies, RMA, am acting as scribe for Sarina Ser, MD .

## 2021-09-02 NOTE — Patient Instructions (Addendum)
Wound Care Instructions  Cleanse wound gently with soap and water once a day then pat dry with clean gauze. Apply a thing coat of Petrolatum (petroleum jelly, "Vaseline") over the wound (unless you have an allergy to this). We recommend that you use a new, sterile tube of Vaseline. Do not pick or remove scabs. Do not remove the yellow or white "healing tissue" from the base of the wound.  Cover the wound with fresh, clean, nonstick gauze and secure with paper tape. You may use Band-Aids in place of gauze and tape if the would is small enough, but would recommend trimming much of the tape off as there is often too much. Sometimes Band-Aids can irritate the skin.  You should call the office for your biopsy report after 1 week if you have not already been contacted.  If you experience any problems, such as abnormal amounts of bleeding, swelling, significant bruising, significant pain, or evidence of infection, please call the office immediately.  FOR ADULT SURGERY PATIENTS: If you need something for pain relief you may take 1 extra strength Tylenol (acetaminophen) AND 2 Ibuprofen (200mg each) together every 4 hours as needed for pain. (do not take these if you are allergic to them or if you have a reason you should not take them.) Typically, you may only need pain medication for 1 to 3 days.    Due to recent changes in healthcare laws, you may see results of your pathology and/or laboratory studies on MyChart before the doctors have had a chance to review them. We understand that in some cases there may be results that are confusing or concerning to you. Please understand that not all results are received at the same time and often the doctors may need to interpret multiple results in order to provide you with the best plan of care or course of treatment. Therefore, we ask that you please give us 2 business days to thoroughly review all your results before contacting the office for clarification. Should we  see a critical lab result, you will be contacted sooner.   If You Need Anything After Your Visit  If you have any questions or concerns for your doctor, please call our main line at [phone] and press option 4 to reach your doctor's medical assistant. If no one answers, please leave a voicemail as directed and we will return your call as soon as possible. Messages left after 4 pm will be answered the following business day.   You may also send us a message via MyChart. We typically respond to MyChart messages within 1-2 business days.  For prescription refills, please ask your pharmacy to contact our office. Our fax number is [phone].  If you have an urgent issue when the clinic is closed that cannot wait until the next business day, you can page your doctor at the number below.    Please note that while we do our best to be available for urgent issues outside of office hours, we are not available 24/7.   If you have an urgent issue and are unable to reach us, you may choose to seek medical care at your doctor's office, retail clinic, urgent care center, or emergency room.  If you have a medical emergency, please immediately call 911 or go to the emergency department.  Pager Numbers  - Dr. Kowalski: [phone]  - Dr. Moye: [phone]  - Dr. Stewart: [phone]  In the event of inclement weather, please call our main line at [phone]   for an update on the status of any delays or closures.  Dermatology Medication Tips: Please keep the boxes that topical medications come in in order to help keep track of the instructions about where and how to use these. Pharmacies typically print the medication instructions only on the boxes and not directly on the medication tubes.   If your medication is too expensive, please contact our office at [phone] option 4 or send us a message through MyChart.   We are unable to tell what your co-pay for medications will be in advance  as this is different depending on your insurance coverage. However, we may be able to find a substitute medication at lower cost or fill out paperwork to get insurance to cover a needed medication.   If a prior authorization is required to get your medication covered by your insurance company, please allow us 1-2 business days to complete this process.  Drug prices often vary depending on where the prescription is filled and some pharmacies may offer cheaper prices.  The website www.goodrx.com contains coupons for medications through different pharmacies. The prices here do not account for what the cost may be with help from insurance (it may be cheaper with your insurance), but the website can give you the price if you did not use any insurance.  - You can print the associated coupon and take it with your prescription to the pharmacy.  - You may also stop by our office during regular business hours and pick up a GoodRx coupon card.  - If you need your prescription sent electronically to a different pharmacy, notify our office through [facility] MyChart or by phone at [phone] option 4.     Si Usted Necesita Algo Despus de Su Visita  Tambin puede enviarnos un mensaje a travs de MyChart. Por lo general respondemos a los mensajes de MyChart en el transcurso de 1 a 2 das hbiles.  Para renovar recetas, por favor pida a su farmacia que se ponga en contacto con nuestra oficina. Nuestro nmero de fax es el [phone].  Si tiene un asunto urgente cuando la clnica est cerrada y que no puede esperar hasta el siguiente da hbil, puede llamar/localizar a su doctor(a) al nmero que aparece a continuacin.   Por favor, tenga en cuenta que aunque hacemos todo lo posible para estar disponibles para asuntos urgentes fuera del horario de oficina, no estamos disponibles las 24 horas del da, los 7 das de la semana.   Si tiene un problema urgente y no puede comunicarse con nosotros, puede optar  por buscar atencin mdica  en el consultorio de su doctor(a), en una clnica privada, en un centro de atencin urgente o en una sala de emergencias.  Si tiene una emergencia mdica, por favor llame inmediatamente al 911 o vaya a la sala de emergencias.  Nmeros de bper  - Dr. Kowalski: [phone]  - Dra. Moye: [phone]  - Dra. Stewart: [phone]  En caso de inclemencias del tiempo, por favor llame a nuestra lnea principal al [phone] para una actualizacin sobre el estado de cualquier retraso o cierre.  Consejos para la medicacin en dermatologa: Por favor, guarde las cajas en las que vienen los medicamentos de uso tpico para ayudarle a seguir las instrucciones sobre dnde y cmo usarlos. Las farmacias generalmente imprimen las instrucciones del medicamento slo en las cajas y no directamente en los tubos del medicamento.   Si su medicamento es muy caro, por favor, pngase en contacto con nuestra   oficina llamando al [phone] y presione la opcin 4 o envenos un mensaje a travs de MyChart.   No podemos decirle cul ser su copago por los medicamentos por adelantado ya que esto es diferente dependiendo de la cobertura de su seguro. Sin embargo, es posible que podamos encontrar un medicamento sustituto a menor costo o llenar un formulario para que el seguro cubra el medicamento que se considera necesario.   Si se requiere una autorizacin previa para que su compaa de seguros cubra su medicamento, por favor permtanos de 1 a 2 das hbiles para completar este proceso.  Los precios de los medicamentos varan con frecuencia dependiendo del lugar de dnde se surte la receta y alguna farmacias pueden ofrecer precios ms baratos.  El sitio web www.goodrx.com tiene cupones para medicamentos de diferentes farmacias. Los precios aqu no tienen en cuenta lo que podra costar con la ayuda del seguro (puede ser ms barato con su seguro), pero el sitio web puede darle el precio si  no utiliz ningn seguro.  - Puede imprimir el cupn correspondiente y llevarlo con su receta a la farmacia.  - Tambin puede pasar por nuestra oficina durante el horario de atencin regular y recoger una tarjeta de cupones de GoodRx.  - Si necesita que su receta se enve electrnicamente a una farmacia diferente, informe a nuestra oficina a travs de MyChart de [facility] o por telfono llamando al [phone] y presione la opcin 4.  

## 2021-09-03 ENCOUNTER — Encounter: Payer: Self-pay | Admitting: Dermatology

## 2021-09-09 ENCOUNTER — Ambulatory Visit (INDEPENDENT_AMBULATORY_CARE_PROVIDER_SITE_OTHER): Payer: BC Managed Care – PPO | Admitting: Dermatology

## 2021-09-09 DIAGNOSIS — L930 Discoid lupus erythematosus: Secondary | ICD-10-CM

## 2021-09-09 MED ORDER — CLOBETASOL PROPIONATE 0.05 % EX CREA: TOPICAL_CREAM | CUTANEOUS | 2 refills | Status: AC

## 2021-09-09 NOTE — Progress Notes (Signed)
   Follow-Up Visit   Subjective  Anglea Davies is a 26 y.o. female who presents for the following: Discoid Lupus bx proven (Arms, scalp, face, bx f/u and suture removal, Pt on Plaquenil as prescribed by Dr. Posey Pronto, and started Clobetasol cream last week using qd, rash on skin started ~ 1 yr ago, pt socially smokes,).   The following portions of the chart were reviewed this encounter and updated as appropriate:   Tobacco  Allergies  Meds  Problems  Med Hx  Surg Hx  Fam Hx      Review of Systems:  No other skin or systemic complaints except as noted in HPI or Assessment and Plan.  Objective  Well appearing patient in no apparent distress; mood and affect are within normal limits.  A focused examination was performed including arms, face, scalp. Relevant physical exam findings are noted in the Assessment and Plan.  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 Currently flared.   Bx proven  Encounter for Removal of Sutures - Incision site at the R forearm is clean, dry and intact - Wound cleansed, sutures removed, wound cleansed and steri strips applied.  - Discussed pathology results showing Discoid Lupus Erythematosus  - Patient advised to keep steri-strips dry until they fall off. - Scars remodel for a full year. - Once steri-strips fall off, patient can apply over-the-counter silicone scar cream each night to help with scar remodeling if desired. - Patient advised to call with any concerns or if they notice any new or changing lesions.   Discussed smoking and sun exposure can trigger flares Cont Plaquenil '200mg'$  as prescribed by Dr. Posey Pronto Cont Clobetasol cr qd/bid aa arms, scalp, discussed should use to affected active areas on face  Topical steroids (such as triamcinolone, fluocinolone, fluocinonide, mometasone, clobetasol,  halobetasol, betamethasone, hydrocortisone) can cause thinning and lightening of the skin if they are used for too long in the same area. Your physician has selected the right strength medicine for your problem and area affected on the body. Please use your medication only as directed by your physician to prevent side effects.    Will request pathology addendum regarding absence or presence of microthrombi  If not responding well, could discuss options of methotrexate or mycophenolate mofetil with rheumatology  Related Medications clobetasol cream (TEMOVATE) 0.05 % Apply 1 -2 times daily to affected areas face, arms, scalp up to 5 days a week. Avoid applying to groin, and axilla.   Return in about 2 months (around 11/09/2021) for Lupus f/u.  I, Othelia Pulling, RMA, am acting as scribe for Forest Gleason, MD .  Documentation: I have reviewed the above documentation for accuracy and completeness, and I agree with the above.  Forest Gleason, MD

## 2021-09-09 NOTE — Patient Instructions (Addendum)
Recommend daily broad spectrum sunscreen SPF 30+ to sun-exposed areas, reapply every 2 hours as needed. Call for new or changing lesions.  Staying in the shade or wearing long sleeves, sun glasses (UVA+UVB protection) and wide brim hats (4-inch brim around the entire circumference of the hat) are also recommended for sun protection.    Recommend taking Heliocare sun protection supplement daily in sunny weather for additional sun protection. For maximum protection on the sunniest days, you can take up to 2 capsules of regular Heliocare OR take 1 capsule of Heliocare Ultra. For prolonged exposure (such as a full day in the sun), you can repeat your dose of the supplement 4 hours after your first dose. Heliocare can be purchased at [employer], at some [facility] or at [email or web address].      Due to recent changes in healthcare laws, you may see results of your pathology and/or laboratory studies on MyChart before the doctors have had a chance to review them. We understand that in some cases there may be results that are confusing or concerning to you. Please understand that not all results are received at the same time and often the doctors may need to interpret multiple results in order to provide you with the best plan of care or course of treatment. Therefore, we ask that you please give [place] 2 business days to thoroughly review all your results before contacting the office for clarification. Should we see a critical lab result, you will be contacted sooner.   If You Need Anything After Your Visit  If you have any questions or concerns for your doctor, please call our main line at [phone] and press option 4 to reach your doctor's medical assistant. If no one answers, please leave a voicemail as directed and we will return your call as soon as possible. Messages left after 4 pm will be answered the following business day.   You may also send [place] a message via [place]. We typically respond to  MyChart messages within 1-2 business days.  For prescription refills, please ask your pharmacy to contact our office. Our fax number is [phone].  If you have an urgent issue when the clinic is closed that cannot wait until the next business day, you can page your doctor at the number below.    Please note that while we do our best to be available for urgent issues outside of office hours, we are not available 24/7.   If you have an urgent issue and are unable to reach [place], you may choose to seek medical care at your doctor's office, retail clinic, urgent care center, or emergency room.  If you have a medical emergency, please immediately call 911 or go to the emergency department.  Pager Numbers  - Dr. Nehemiah Massed: [phone]  - Dr. Laurence Ferrari: [phone]  - Dr. Nicole Kindred: [phone]  In the event of inclement weather, please call our main line at [phone] for an update on the status of any delays or closures.  Dermatology Medication Tips: Please keep the boxes that topical medications come in in order to help keep track of the instructions about where and how to use these. Pharmacies typically print the medication instructions only on the boxes and not directly on the medication tubes.   If your medication is too expensive, please contact our office at [phone] option 4 or send [place] a message through [facility].   We are unable to tell what your co-pay for medications will be in advance as this  as this is different depending on your insurance coverage. However, we may be able to find a substitute medication at lower cost or fill out paperwork to get insurance to cover a needed medication.   If a prior authorization is required to get your medication covered by your insurance company, please allow us 1-2 business days to complete this process.  Drug prices often vary depending on where the prescription is filled and some pharmacies may offer cheaper prices.  The website www.goodrx.com  contains coupons for medications through different pharmacies. The prices here do not account for what the cost may be with help from insurance (it may be cheaper with your insurance), but the website can give you the price if you did not use any insurance.  - You can print the associated coupon and take it with your prescription to the pharmacy.  - You may also stop by our office during regular business hours and pick up a GoodRx coupon card.  - If you need your prescription sent electronically to a different pharmacy, notify our office through [facility] MyChart or by phone at [phone] option 4.     Si Usted Necesita Algo Despus de Su Visita  Tambin puede enviarnos un mensaje a travs de MyChart. Por lo general respondemos a los mensajes de MyChart en el transcurso de 1 a 2 das hbiles.  Para renovar recetas, por favor pida a su farmacia que se ponga en contacto con nuestra oficina. Nuestro nmero de fax es el [phone].  Si tiene un asunto urgente cuando la clnica est cerrada y que no puede esperar hasta el siguiente da hbil, puede llamar/localizar a su doctor(a) al nmero que aparece a continuacin.   Por favor, tenga en cuenta que aunque hacemos todo lo posible para estar disponibles para asuntos urgentes fuera del horario de oficina, no estamos disponibles las 24 horas del da, los 7 das de la semana.   Si tiene un problema urgente y no puede comunicarse con nosotros, puede optar por buscar atencin mdica  en el consultorio de su doctor(a), en una clnica privada, en un centro de atencin urgente o en una sala de emergencias.  Si tiene una emergencia mdica, por favor llame inmediatamente al 911 o vaya a la sala de emergencias.  Nmeros de bper  - Dr. Kowalski: [phone]  - Dra. Moye: [phone]  - Dra. Stewart: [phone]  En caso de inclemencias del tiempo, por favor llame a nuestra lnea principal al [phone] para una actualizacin sobre el estado  de cualquier retraso o cierre.  Consejos para la medicacin en dermatologa: Por favor, guarde las cajas en las que vienen los medicamentos de uso tpico para ayudarle a seguir las instrucciones sobre dnde y cmo usarlos. Las farmacias generalmente imprimen las instrucciones del medicamento slo en las cajas y no directamente en los tubos del medicamento.   Si su medicamento es muy caro, por favor, pngase en contacto con nuestra oficina llamando al [phone] y presione la opcin 4 o envenos un mensaje a travs de MyChart.   No podemos decirle cul ser su copago por los medicamentos por adelantado ya que esto es diferente dependiendo de la cobertura de su seguro. Sin embargo, es posible que podamos encontrar un medicamento sustituto a menor costo o llenar un formulario para que el seguro cubra el medicamento que se considera necesario.   Si se requiere una autorizacin previa para que su compaa de seguros cubra su medicamento, por favor permtanos de 1 a 2 das   hbiles para completar este proceso.  Los precios de los medicamentos varan con frecuencia dependiendo del lugar de dnde se surte la receta y alguna farmacias pueden ofrecer precios ms baratos.  El sitio web www.goodrx.com tiene cupones para medicamentos de diferentes farmacias. Los precios aqu no tienen en cuenta lo que podra costar con la ayuda del seguro (puede ser ms barato con su seguro), pero el sitio web puede darle el precio si no utiliz ningn seguro.  - Puede imprimir el cupn correspondiente y llevarlo con su receta a la farmacia.  - Tambin puede pasar por nuestra oficina durante el horario de atencin regular y recoger una tarjeta de cupones de GoodRx.  - Si necesita que su receta se enve electrnicamente a una farmacia diferente, informe a nuestra oficina a travs de MyChart de [facility] o por telfono llamando al [phone] y presione la opcin 4.  

## 2021-09-14 ENCOUNTER — Encounter: Payer: Self-pay | Admitting: Dermatology

## 2021-09-30 ENCOUNTER — Telehealth: Payer: Self-pay

## 2021-09-30 NOTE — Telephone Encounter (Signed)
Left message on voicemail to return my call.  

## 2021-09-30 NOTE — Telephone Encounter (Signed)
-----   Message from [place], MD sent at 09/29/2021  1:05 PM EDT ----- Just let her know what Dr. Raliegh Ip said - that they looked through the tissue more and confirmed there was no inflammation of the blood vessels or small blood clots - and that we are updating rheumatology with the information (they requested it). Please also be sure rheumatology was given the update. Thank you!   ----- Message ----- From: Rosaria Ferries, CMA Sent: 09/27/2021   8:57 AM EDT To: Alfonso Patten, MD  What should we advise patient? ----- Message ----- From: Ralene Bathe, MD Sent: 09/23/2021   5:56 PM EDT To: Nehemiah Massed Clinical  1&2 pathology and DIF consistent with Discoid Lupus  Addendum indicates no vasculitis or microthrombi evident.  ADDENDUM: Additional deeper sections through the block are performed. Areas of vasculitis or microthrombi are not present in tissue examined. Clinical pathologic correlation is advised.

## 2021-10-05 ENCOUNTER — Telehealth: Payer: Self-pay

## 2021-10-05 NOTE — Telephone Encounter (Signed)
Left message on voicemail to return my call.  

## 2021-10-05 NOTE — Telephone Encounter (Signed)
-----   Message from [place], MD sent at 09/29/2021  1:05 PM EDT ----- Just let her know what Dr. Raliegh Ip said - that they looked through the tissue more and confirmed there was no inflammation of the blood vessels or small blood clots - and that we are updating rheumatology with the information (they requested it). Please also be sure rheumatology was given the update. Thank you!   ----- Message ----- From: Rosaria Ferries, CMA Sent: 09/27/2021   8:57 AM EDT To: Alfonso Patten, MD  What should we advise patient? ----- Message ----- From: Ralene Bathe, MD Sent: 09/23/2021   5:56 PM EDT To: Nehemiah Massed Clinical  1&2 pathology and DIF consistent with Discoid Lupus  Addendum indicates no vasculitis or microthrombi evident.  ADDENDUM: Additional deeper sections through the block are performed. Areas of vasculitis or microthrombi are not present in tissue examined. Clinical pathologic correlation is advised.

## 2021-10-14 ENCOUNTER — Telehealth: Payer: Self-pay

## 2021-10-14 NOTE — Telephone Encounter (Signed)
-----   Message from [place], MD sent at 09/29/2021  1:05 PM EDT ----- Just let her know what Dr. Raliegh Ip said - that they looked through the tissue more and confirmed there was no inflammation of the blood vessels or small blood clots - and that we are updating rheumatology with the information (they requested it). Please also be sure rheumatology was given the update. Thank you!   ----- Message ----- From: Rosaria Ferries, CMA Sent: 09/27/2021   8:57 AM EDT To: Alfonso Patten, MD  What should we advise patient? ----- Message ----- From: Ralene Bathe, MD Sent: 09/23/2021   5:56 PM EDT To: Nehemiah Massed Clinical  1&2 pathology and DIF consistent with Discoid Lupus  Addendum indicates no vasculitis or microthrombi evident.  ADDENDUM: Additional deeper sections through the block are performed. Areas of vasculitis or microthrombi are not present in tissue examined. Clinical pathologic correlation is advised.

## 2021-10-14 NOTE — Telephone Encounter (Signed)
Patient informed of pathology results 

## 2021-11-10 ENCOUNTER — Encounter: Payer: Self-pay | Admitting: Dermatology

## 2021-11-10 ENCOUNTER — Ambulatory Visit (INDEPENDENT_AMBULATORY_CARE_PROVIDER_SITE_OTHER): Payer: BC Managed Care – PPO | Admitting: Dermatology

## 2021-11-10 DIAGNOSIS — L930 Discoid lupus erythematosus: Secondary | ICD-10-CM | POA: Diagnosis not present

## 2021-11-10 NOTE — Progress Notes (Signed)
   Follow-Up Visit   Subjective  Samantha Davies is a 26 y.o. female who presents for the following: Follow-up (2 month recheck. Discoid lupus erythematosus. Has been using Clobetasol cream as directed to active lesions. Has noticed improvement since last visit. No new spots since last visit. Has some itching at areas, briefly and not like before).  The following portions of the chart were reviewed this encounter and updated as appropriate:  Tobacco  Allergies  Meds  Problems  Med Hx  Surg Hx  Fam Hx      Review of Systems: No other skin or systemic complaints except as noted in HPI or Assessment and Plan.   Objective  Well appearing patient in no apparent distress; mood and affect are within normal limits.  A focused examination was performed including arms, face, scalp. Relevant physical exam findings are noted in the Assessment and Plan.  face, scalp, arms Scaly hyperpigmented atrophic plaques with scarring   Assessment & Plan  Discoid lupus erythematosus face, scalp, arms  Chronic and persistent condition with duration or expected duration over one year. Condition is symptomatic / bothersome to patient. Not to goal.  Stop Clobetasol cream for areas that are no longer symptomatic. Use Clobetasol for new or symptomatic areas twice a day including the face to  prevent scarring. (Itching, inflammation). Cover with Vaseline Jelly at bedtime if okay with feeling greasy at bedtime.   [facility] CTX prescription silicone sample given today. Apply twice daily to affected areas, for scarring and inflammation. Call if would like prescription sent in for this and can otherwise prescribe silicone sar cream from [facility]  Stressed importance of avoiding UV and smoke exposure.  Related Medications clobetasol cream (TEMOVATE) 0.05 % Apply 1 -2 times daily to affected areas face, arms, scalp up to 5 days a week. Avoid applying to groin, and axilla.   Return for DLE recheck 3-4 months.  I,  Emelia Salisbury, CMA, am acting as scribe for Forest Gleason, MD.  Documentation: I have reviewed the above documentation for accuracy and completeness, and I agree with the above.  Forest Gleason, MD

## 2021-11-10 NOTE — Patient Instructions (Addendum)
Stop Clobetasol cream for areas that are no longer symptomatic. Use Clobetasol for new or symptomatic areas. (Itching, inflammation). Cover with Vaseline Jelly at bedtime if okay with feeling greasy at bedtime.   Strata CTX sample given today. Twice daily to affected areas, for scarring. Call if would like prescription sent in.   Recommend taking Heliocare sun protection supplement daily in sunny weather for additional sun protection. For maximum protection on the sunniest days, you can take up to 2 capsules of regular Heliocare OR take 1 capsule of Heliocare Ultra. For prolonged exposure (such as a full day in the sun), you can repeat your dose of the supplement 4 hours after your first dose. Heliocare can be purchased at [employer], at some [facility] or at [email or web address].    Recommend daily broad spectrum sunscreen SPF 30+ to sun-exposed areas, reapply every 2 hours as needed. Call for new or changing lesions.  Staying in the shade or wearing long sleeves, sun glasses (UVA+UVB protection) and wide brim hats (4-inch brim around the entire circumference of the hat) are also recommended for sun protection.    Due to recent changes in healthcare laws, you may see results of your pathology and/or laboratory studies on MyChart before the doctors have had a chance to review them. We understand that in some cases there may be results that are confusing or concerning to you. Please understand that not all results are received at the same time and often the doctors may need to interpret multiple results in order to provide you with the best plan of care or course of treatment. Therefore, we ask that you please give [place] 2 business days to thoroughly review all your results before contacting the office for clarification. Should we see a critical lab result, you will be contacted sooner.   If You Need Anything After Your Visit  If you have any questions or concerns for your doctor, please call our  main line at [phone] and press option 4 to reach your doctor's medical assistant. If no one answers, please leave a voicemail as directed and we will return your call as soon as possible. Messages left after 4 pm will be answered the following business day.   You may also send [place] a message via [place]. We typically respond to MyChart messages within 1-2 business days.  For prescription refills, please ask your pharmacy to contact our office. Our fax number is [phone].  If you have an urgent issue when the clinic is closed that cannot wait until the next business day, you can page your doctor at the number below.    Please note that while we do our best to be available for urgent issues outside of office hours, we are not available 24/7.   If you have an urgent issue and are unable to reach [place], you may choose to seek medical care at your doctor's office, retail clinic, urgent care center, or emergency room.  If you have a medical emergency, please immediately call 911 or go to the emergency department.  Pager Numbers  - Dr. Nehemiah Massed: [phone]  - Dr. Laurence Ferrari: [phone]  - Dr. Nicole Kindred: [phone]  In the event of inclement weather, please call our main line at [phone] for an update on the status of any delays or closures.  Dermatology Medication Tips: Please keep the boxes that topical medications come in in order to help keep track of the instructions about where and how to use these. Pharmacies typically print the medication  instructions only on the boxes and not directly on the medication tubes.   If your medication is too expensive, please contact our office at [phone] option 4 or send [place] a message through [place].   We are unable to tell what your co-pay for medications will be in advance as this is different depending on your insurance coverage. However, we may be able to find a substitute medication at lower cost or fill out paperwork to get insurance to  cover a needed medication.   If a prior authorization is required to get your medication covered by your insurance company, please allow [place] 1-2 business days to complete this process.  Drug prices often vary depending on where the prescription is filled and some pharmacies may offer cheaper prices.  The website www.goodrx.com contains coupons for medications through different pharmacies. The prices here do not account for what the cost may be with help from insurance (it may be cheaper with your insurance), but the website can give you the price if you did not use any insurance.  - You can print the associated coupon and take it with your prescription to the pharmacy.  - You may also stop by our office during regular business hours and pick up a GoodRx coupon card.  - If you need your prescription sent electronically to a different pharmacy, notify our office through [facility] or by phone at [phone] option 4.     Si Usted Necesita Algo Despus de Su Visita  Tambin puede enviarnos un mensaje a travs de Pharmacist, community. Por lo general respondemos a los mensajes de MyChart en el transcurso de 1 a 2 das hbiles.  Para renovar recetas, por favor pida a su farmacia que se ponga en contacto con nuestra oficina. Harland Dingwall de fax es [place] [phone].  Si tiene un asunto urgente cuando la clnica est cerrada y que no puede esperar hasta el siguiente da hbil, puede llamar/localizar a su doctor(a) al nmero que aparece a continuacin.   Por favor, tenga en cuenta que aunque hacemos todo lo posible para estar disponibles para asuntos urgentes fuera del horario de [place], no estamos disponibles las 24 horas del da, los 7 das de la [place].   Si tiene un problema urgente y no puede comunicarse con nosotros, puede optar por buscar atencin mdica  en el consultorio de su doctor(a), en una clnica privada, en un centro de atencin urgente o en una sala de emergencias.  Si tiene Conservator, museum/gallery, por favor llame inmediatamente al 911 o vaya a la sala de emergencias.  Nmeros de bper  - Dr. Nehemiah Massed: [phone]  - Dra. Moye: [phone]  - Dra. Nicole Kindred: [phone]  En caso de inclemencias del [place], por favor llame a Johnsie Kindred principal al [phone] para una actualizacin sobre el [place] de cualquier retraso o cierre.  Consejos para la medicacin en dermatologa: Por favor, guarde las cajas en las que vienen los medicamentos de uso tpico para ayudarle a seguir las instrucciones sobre dnde y cmo usarlos. Las farmacias generalmente imprimen las instrucciones del medicamento slo en las cajas y no directamente en los tubos del [place].   Si su medicamento es muy caro, por favor, pngase en contacto con Zigmund Daniel llamando al [phone] y presione la opcin 4 o envenos un mensaje a travs de Pharmacist, community.   No podemos decirle cul ser su copago por los medicamentos por adelantado ya que esto es diferente dependiendo de la cobertura de su seguro. Sin embargo,  es posible que podamos encontrar un medicamento sustituto a Electrical engineer un formulario para que el seguro cubra el medicamento que se considera necesario.   Si se requiere una autorizacin previa para que su compaa de seguros [place] su medicamento, por favor permtanos de 1 a 2 das hbiles para completar este proceso.  Los precios de los medicamentos varan con frecuencia dependiendo del Environmental consultant de dnde se surte la receta y alguna farmacias pueden ofrecer precios ms baratos.  El sitio web www.goodrx.com tiene cupones para medicamentos de Airline pilot. Los precios aqu no tienen en cuenta lo que podra costar con la ayuda del seguro (puede ser ms barato con su seguro), pero el sitio web puede darle el precio si no utiliz Research scientist (physical sciences).  - Puede imprimir el cupn correspondiente y llevarlo con su receta a la farmacia.  - Tambin puede pasar por nuestra oficina durante el  horario de atencin regular y Charity fundraiser una tarjeta de cupones de GoodRx.  - Si necesita que su receta se enve electrnicamente a una farmacia diferente, informe a nuestra oficina a travs de MyChart de [place] o por telfono llamando al [phone] y presione la opcin 4.

## 2021-11-22 ENCOUNTER — Encounter: Payer: Self-pay | Admitting: Dermatology

## 2022-02-10 ENCOUNTER — Ambulatory Visit: Payer: BC Managed Care – PPO | Admitting: Dermatology

## 2023-05-16 ENCOUNTER — Other Ambulatory Visit: Payer: Self-pay

## 2023-05-16 ENCOUNTER — Emergency Department

## 2023-05-16 ENCOUNTER — Emergency Department
Admission: EM | Admit: 2023-05-16 | Discharge: 2023-05-16 | Disposition: A | Discharge status: Home / Self Care | Attending: Student in an Organized Health Care Education/Training Program | Admitting: Student in an Organized Health Care Education/Training Program

## 2023-05-16 DIAGNOSIS — R0781 Pleurodynia: Secondary | ICD-10-CM | POA: Insufficient documentation

## 2023-05-16 DIAGNOSIS — F419 Anxiety disorder, unspecified: Secondary | ICD-10-CM | POA: Diagnosis not present

## 2023-05-16 DIAGNOSIS — R0602 Shortness of breath: Secondary | ICD-10-CM | POA: Insufficient documentation

## 2023-05-16 DIAGNOSIS — R079 Chest pain, unspecified: Secondary | ICD-10-CM

## 2023-05-16 LAB — CBC
HCT: 40.1 % (ref 36.0–46.0)
Hemoglobin: 13.6 g/dL (ref 12.0–15.0)
MCH: 29.8 pg (ref 26.0–34.0)
MCHC: 33.9 g/dL (ref 30.0–36.0)
MCV: 87.9 fL (ref 80.0–100.0)
Platelets: 218 10*3/uL (ref 150–400)
RBC: 4.56 MIL/uL (ref 3.87–5.11)
RDW: 13.9 % (ref 11.5–15.5)
WBC: 11.4 10*3/uL — ABNORMAL HIGH (ref 4.0–10.5)
nRBC: 0 % (ref 0.0–0.2)

## 2023-05-16 LAB — BASIC METABOLIC PANEL
Anion gap: 11 (ref 5–15)
BUN: 9 mg/dL (ref 6–20)
CO2: 23 mmol/L (ref 22–32)
Calcium: 10 mg/dL (ref 8.9–10.3)
Chloride: 103 mmol/L (ref 98–111)
Creatinine, Ser: 0.75 mg/dL (ref 0.44–1.00)
GFR, Estimated: >60 mL/min (ref >60)
Glucose, Bld: 92 mg/dL (ref 70–99)
Potassium: 3.6 mmol/L (ref 3.5–5.1)
Sodium: 137 mmol/L (ref 135–145)

## 2023-05-16 LAB — D-DIMER, QUANTITATIVE: D-Dimer, Quant: 0.42 ug{FEU}/mL (ref 0.00–0.50)

## 2023-05-16 LAB — TROPONIN I (HIGH SENSITIVITY): Troponin I (High Sensitivity): <2 ng/L (ref <18)

## 2023-05-16 MED ORDER — ALBUTEROL SULFATE HFA 108 (90 BASE) MCG/ACT IN AERS
2.0000 | INHALATION_SPRAY | Freq: Four times a day (QID) | RESPIRATORY_TRACT | 2 refills | Status: AC | PRN
Start: 2023-05-16 — End: ?

## 2023-05-16 MED ORDER — IPRATROPIUM-ALBUTEROL 0.5-2.5 (3) MG/3ML IN SOLN
3.0000 mL | Freq: Once | RESPIRATORY_TRACT | Status: AC
Start: 2023-05-16 — End: 2023-05-16
  Administered 2023-05-16: 3 mL via RESPIRATORY_TRACT
  Filled 2023-05-16: qty 3

## 2023-05-16 MED ORDER — AZITHROMYCIN 250 MG PO TABS: ORAL_TABLET | ORAL | 0 refills | Status: AC

## 2023-05-16 MED ORDER — LORAZEPAM 1 MG PO TABS
0.5000 mg | ORAL_TABLET | Freq: Once | ORAL | Status: AC
  Administered 2023-05-16: 0.5 mg via ORAL
  Filled 2023-05-16: qty 1

## 2023-05-16 NOTE — ED Notes (Signed)
 See triage note  Presents with some chest pain which started this am   States she feels SOB  Pain to chest increases with inspiration

## 2023-05-16 NOTE — ED Provider Notes (Signed)
 [facility] Provider Note    Event Date/Time   First MD Initiated Contact with Patient 05/16/23 1413     (approximate)   History   Shortness of Breath and Chest Pain   HPI  Samantha Davies is a 28 y.o. female with a history of lupus and on OCP presents to the ER for evaluation of chest pain and pleuritic discomfort.  Symptoms started yesterday.  States she does feel anxious denies any pain at rest.  Denies any nausea or vomiting.  Has a history of heartburn and reflux took some Tums and Mylanta without any change in character.  Denies any measured fevers or productive cough.     Physical Exam   Triage Vital Signs: ED Triage Vitals  Encounter Vitals Group     BP 05/16/23 1322 118/82     Systolic BP Percentile --      Diastolic BP Percentile --      Pulse Rate 05/16/23 1322 84     Resp 05/16/23 1322 16     Temp 05/16/23 1322 98.6 F (37 C)     Temp Source 05/16/23 1322 Oral     SpO2 05/16/23 1322 100 %     Weight 05/16/23 1323 126 lb (57.2 kg)     Height 05/16/23 1323 5\' 1"  (1.549 m)     Head Circumference --      Peak Flow --      Pain Score 05/16/23 1336 7     Pain Loc --      Pain Education --      Exclude from Growth Chart --     Most recent vital signs: Vitals:   05/16/23 1322  BP: 118/82  Pulse: 84  Resp: 16  Temp: 98.6 F (37 C)  SpO2: 100%     Constitutional: Alert  Eyes: Conjunctivae are normal.  Head: Atraumatic. Nose: No congestion/rhinnorhea. Mouth/Throat: Mucous membranes are moist.   Neck: Painless ROM.  Cardiovascular:   Good peripheral circulation. Respiratory: Normal respiratory effort.  No retractions.  Gastrointestinal: Soft and nontender.  Musculoskeletal:  no deformity Neurologic:  MAE spontaneously. No gross focal neurologic deficits are appreciated.  Skin:  Skin is warm, dry and intact. No rash noted.      ED Results / Procedures / Treatments   Labs (all labs ordered are listed, but only abnormal  results are displayed) Labs Reviewed  CBC - Abnormal; Notable for the following components:      Result Value   WBC 11.4 (*)    All other components within normal limits  BASIC METABOLIC PANEL  D-DIMER, QUANTITATIVE  POC URINE PREG, ED  TROPONIN I (HIGH SENSITIVITY)     EKG ED ECG REPORT I, Willy Eddy, the attending physician, personally viewed and interpreted this ECG.   Date: 05/16/2023  EKG Time: 13:22  Rate: 80  Rhythm: sinus  Axis: normal  Intervals: normal  ST&T Change: no stemi,no depressions     RADIOLOGY Please see ED Course for my review and interpretation.  I personally reviewed all radiographic images ordered to evaluate for the above acute complaints and reviewed radiology reports and findings.  These findings were personally discussed with the patient.  Please see medical record for radiology report.    PROCEDURES:  Critical Care performed: No  Procedures   MEDICATIONS ORDERED IN ED: Medications  ipratropium-albuterol (DUONEB) 0.5-2.5 (3) MG/3ML nebulizer solution 3 mL (has no administration in time range)  LORazepam (ATIVAN) tablet 0.5 mg (0.5 mg Oral Given  05/16/23 1443)     IMPRESSION / MDM / ASSESSMENT AND PLAN / ED COURSE  I reviewed the triage vital signs and the nursing notes.                              Differential diagnosis includes, but is not limited to, ACS, pericarditis, esophagitis, boerhaaves, pe, dissection, pna, bronchitis, costochondritis  Patient presenting to the ER for evaluation of symptoms as described above.  Based on symptoms, risk factors and considered above differential, this presenting complaint could reflect a potentially life-threatening illness therefore the patient will be placed on continuous pulse oximetry and telemetry for monitoring.  Laboratory evaluation will be sent to evaluate for the above complaints.      Clinical Course as of 05/16/23 1517  Tue May 16, 2023  1507 Dyspnea with pleuritic chest  pain. Low risk wells criteria. D dimer pending. If neg, discharge home. EKG reassuring [SM]  1511 Patient will be out to oncoming physician pending follow-up D-dimer.  Anticipate discharge home if negative. [PR]  1515 D-dimer is negative.  Patient reassessed and feels improved after Ativan.  May have a component of bronchitis will give inhaler as well as prescription for azithromycin. [PR]    Clinical Course User Index [PR] Willy Eddy, MD [SM] Corena Herter, MD     FINAL CLINICAL IMPRESSION(S) / ED DIAGNOSES   Final diagnoses:  Chest pain, unspecified type     Rx / DC Orders   ED Discharge Orders     None        Note:  This document was prepared using Dragon voice recognition software and may include unintentional dictation errors.    Willy Eddy, MD 05/16/23 [phone]

## 2023-05-16 NOTE — ED Provider Notes (Signed)
 Care assumed of patient from outgoing provider.  See their note for initial history, exam and plan.  Clinical Course as of 05/16/23 1513  Tue May 16, 2023  1507 Dyspnea with pleuritic chest pain. Low risk wells criteria. D dimer pending. If neg, discharge home. EKG reassuring [SM]  1511 Patient will be out to oncoming physician pending follow-up D-dimer.  Anticipate discharge home if negative. [PR]    Clinical Course User Index [PR] Willy Eddy, MD [SM] Corena Herter, MD     Corena Herter, MD 05/16/23 [phone]

## 2023-05-16 NOTE — ED Triage Notes (Signed)
 Pt to ED for shob and chest pain started this am. Reports worsening pain with inspiration. States hx of GERD, took pepto and TUMS earlier with no relief. NAD noted

## 2023-06-23 IMAGING — CT CT ABD-PELV W/ CM
2 of 5 series · 15 of 46 positions shown, 17 images · IV contrast (APPLIED)
Comparison: None.

CLINICAL DATA: Abdominal pain for the past 3 days with nausea and
vomiting. Patient states "I think my appendix has ruptured".

EXAM:
CT ABDOMEN AND PELVIS WITH CONTRAST
TECHNIQUE: Multidetector CT imaging of the abdomen and pelvis was performed
using the standard protocol following bolus administration of
intravenous contrast.

[Series 3: abdomen 2.0 · axial · 0.70mm/px · z∈[+856,+1246]mm · 12 of 221 slices shown, 14 images]
[im 13/221  soft-tissue]
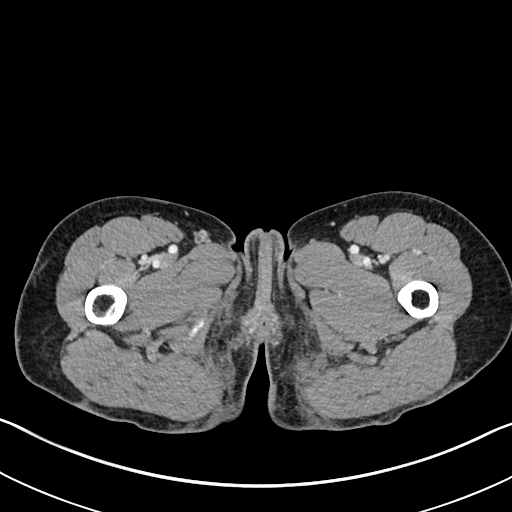
[im 13/221  bone]
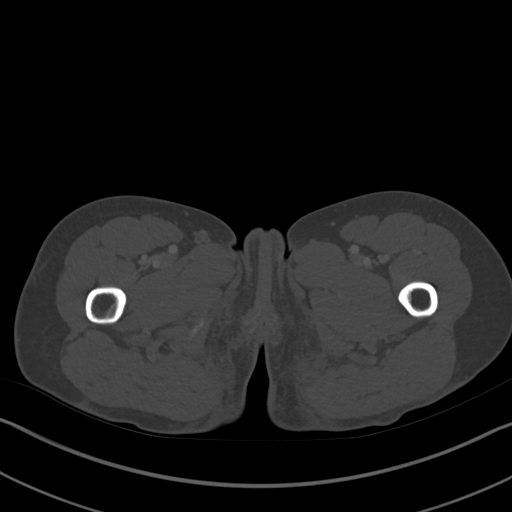
[im 37/221  soft-tissue]
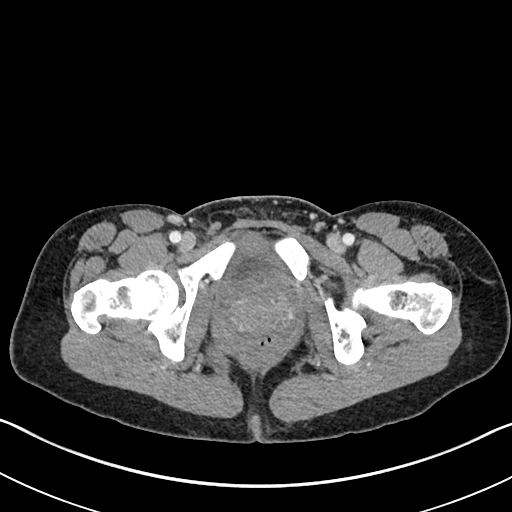
[im 49/221  soft-tissue]
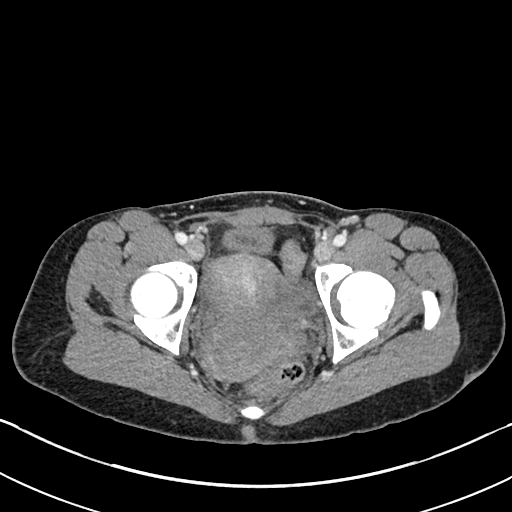
[im 62/221  soft-tissue]
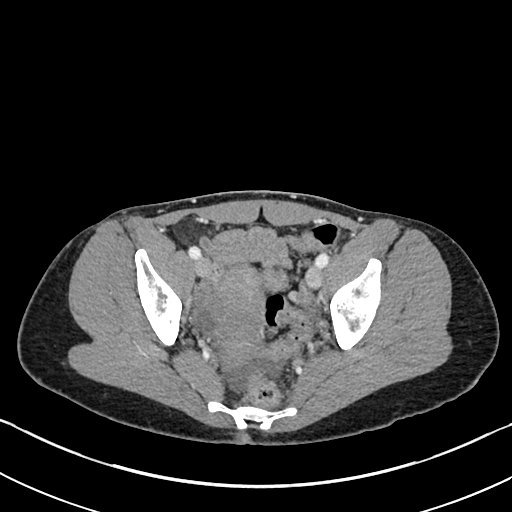
[im 86/221  soft-tissue]
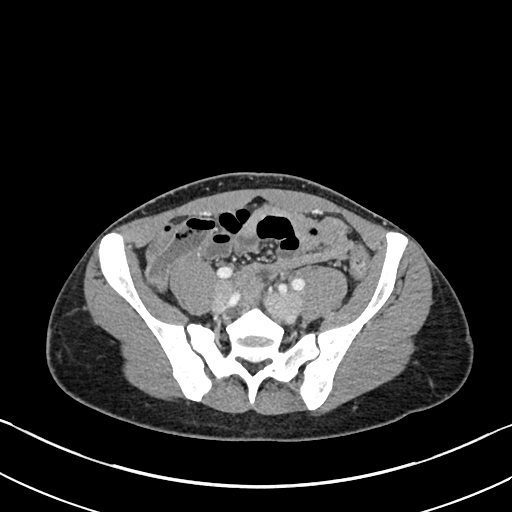
[im 98/221  soft-tissue]
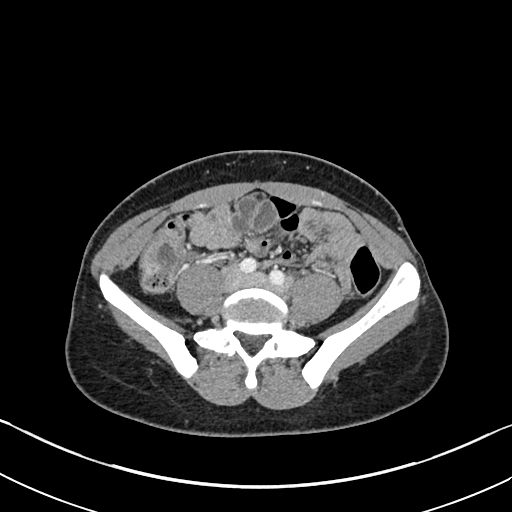
[im 123/221  soft-tissue]
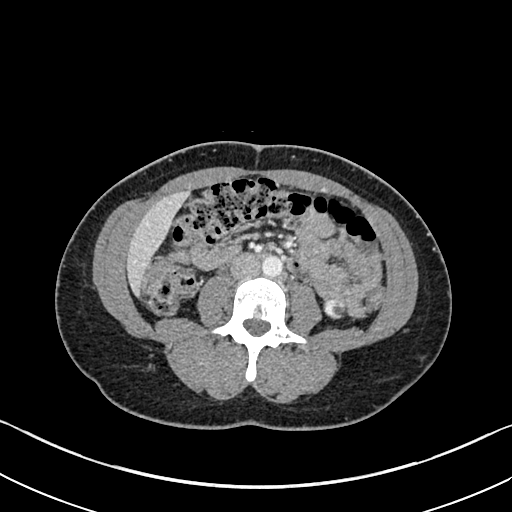
[im 135/221  soft-tissue]
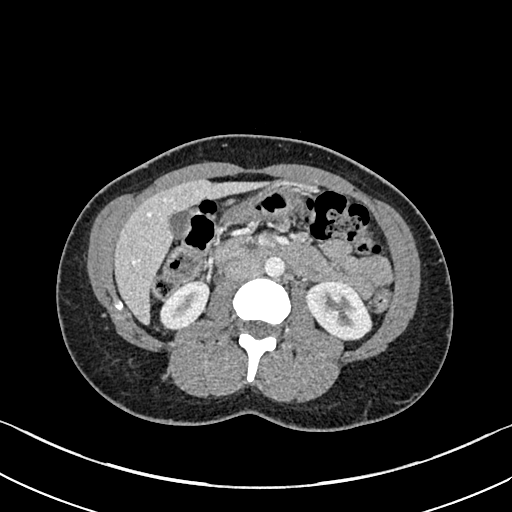
[im 159/221  soft-tissue]
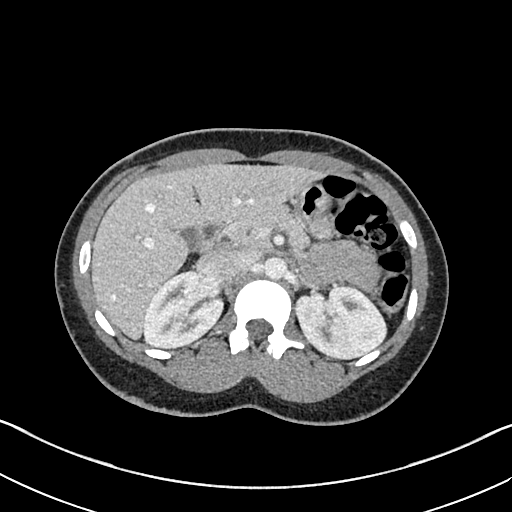
[im 159/221  bone]
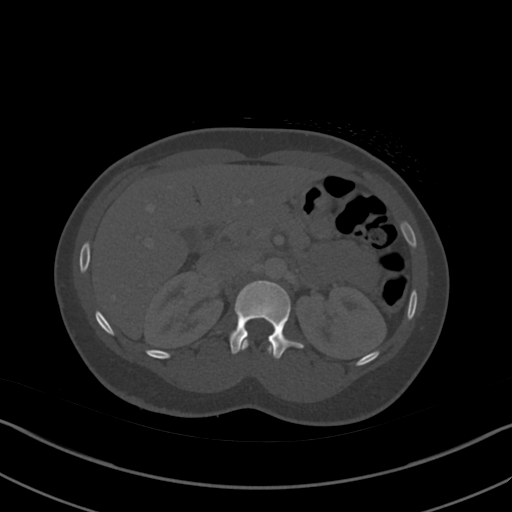
[im 172/221  soft-tissue]
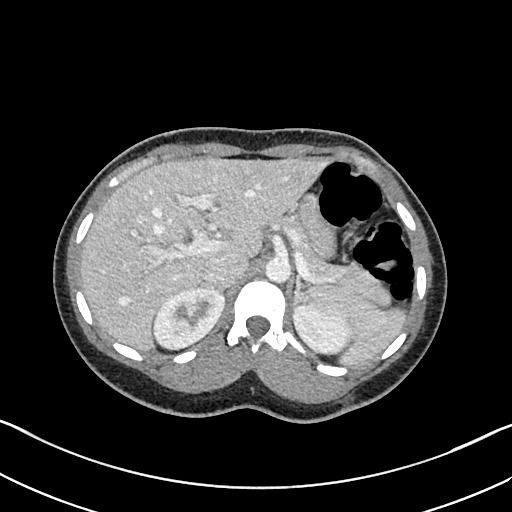
[im 184/221  soft-tissue]
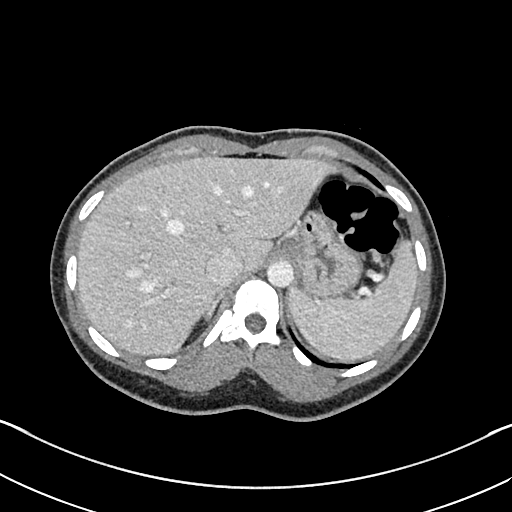
[im 208/221  soft-tissue]
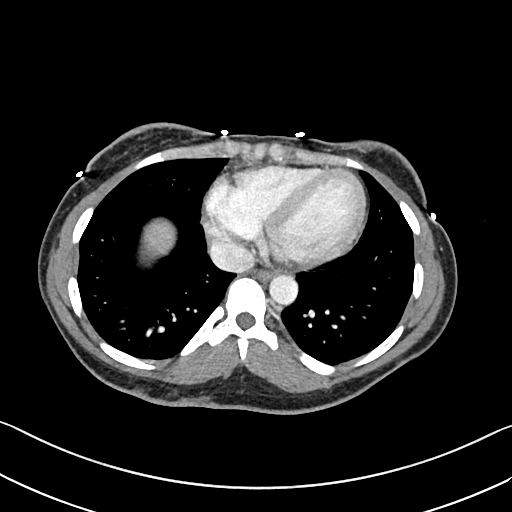

[Series 5: abdomen 3.0 mpr cor · coronal · 0.70mm/px · 3 of 80 slices shown]
[im 27/80  soft-tissue]
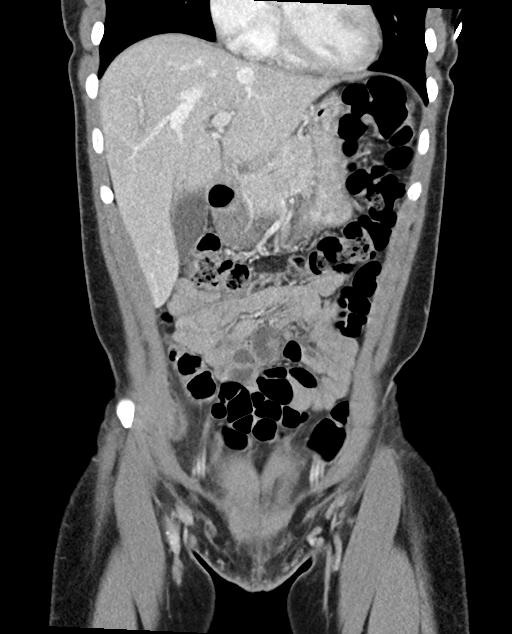
[im 36/80  soft-tissue]
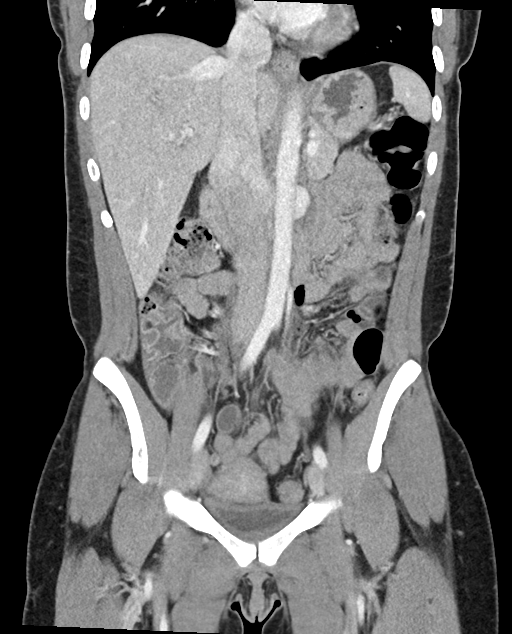
[im 44/80  soft-tissue]
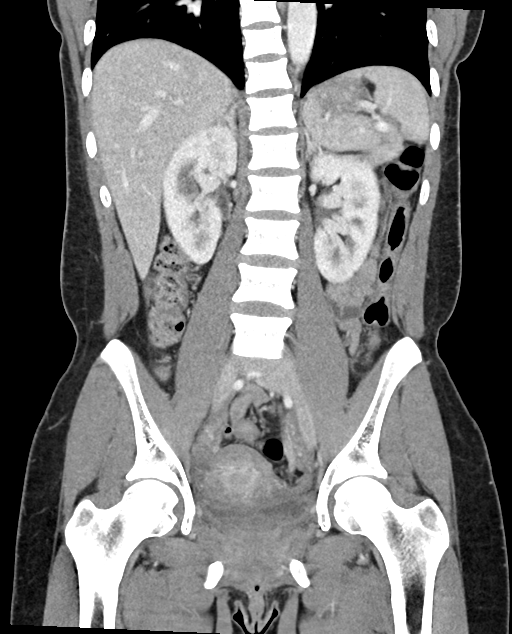

[15 of 46 positions shown; findings below may reference images not displayed]

RADIATION DOSE REDUCTION: This exam was performed according to the
departmental dose-optimization program which includes automated
exposure control, adjustment of the mA and/or kV according to
patient size and/or use of iterative reconstruction technique.

CONTRAST:  80mL OMNIPAQUE IOHEXOL 300 MG/ML  SOLN
FINDINGS: Lower chest: Clear lung bases. Normal heart size without pericardial
or pleural effusion.

Hepatobiliary: Normal liver. Normal gallbladder, without biliary
ductal dilatation.

Pancreas: Normal, without mass or ductal dilatation.

Spleen: Normal in size, without focal abnormality.

Adrenals/Urinary Tract: Normal adrenal glands. Lower pole left renal
1.0 cm cyst . No f/up imaging recommended. Normal right kidney. No
hydronephrosis. Normal urinary bladder.

Stomach/Bowel: Normal stomach, without wall thickening. Colonic
stool burden suggests constipation. Normal terminal ileum on 52/2.

The appendix is fluid-filled throughout the majority of its course.
Measures 6 mm proximally including on 63/2, upper normal. Gas within
its tip including on coronal image 43.

Normal small bowel.

Vascular/Lymphatic: Normal caliber of the aorta and branch vessels.
No abdominopelvic adenopathy.

Reproductive: Intrauterine device is positioned only within the body
and lower uterine segment. Example 66/2.

Dominant follicle within the right ovary at 2.0 cm on 64/2.

Other: Trace free pelvic fluid is likely physiologic. No free
intraperitoneal air.

Musculoskeletal: Nonspecific subcutaneous edema about the right
flank including on [DATE], relatively mild. Mild convex left lumbar
spine curvature.
IMPRESSION: 1. The proximal and mid appendix are fluid-filled and upper normal
in caliber. The distal appendix is gas-filled. Especially given 3
days of symptoms, favored to be within normal variation. If the
patient's symptoms are more hyperacute, clinical surveillance should
be considered to exclude early or mild appendicitis.
2. No other explanation for right-sided pain. Dominant right ovarian
follicle with trace cul-de-sac fluid could be physiologic or relate
to follicle rupture.
3. Intrauterine device positioned only within the body and lower
uterine segment. Does not extend to the uterine fundus.
4.  Possible constipation.
5. Nonspecific right flank subcutaneous edema. Consider physical
exam correlation.

## 2023-07-27 ENCOUNTER — Other Ambulatory Visit: Payer: Self-pay

## 2023-07-27 ENCOUNTER — Emergency Department

## 2023-07-27 ENCOUNTER — Encounter: Payer: Self-pay | Admitting: Emergency Medicine

## 2023-07-27 ENCOUNTER — Emergency Department
Admission: EM | Admit: 2023-07-27 | Discharge: 2023-07-27 | Disposition: A | Discharge status: Home / Self Care | Attending: Emergency Medicine | Admitting: Emergency Medicine

## 2023-07-27 DIAGNOSIS — R072 Precordial pain: Secondary | ICD-10-CM | POA: Diagnosis present

## 2023-07-27 DIAGNOSIS — R079 Chest pain, unspecified: Secondary | ICD-10-CM

## 2023-07-27 LAB — TROPONIN I (HIGH SENSITIVITY)
Troponin I (High Sensitivity): <2 ng/L (ref <18)
Troponin I (High Sensitivity): <2 ng/L (ref <18)

## 2023-07-27 LAB — CBC
HCT: 41.8 % (ref 36.0–46.0)
Hemoglobin: 13.8 g/dL (ref 12.0–15.0)
MCH: 28.5 pg (ref 26.0–34.0)
MCHC: 33 g/dL (ref 30.0–36.0)
MCV: 86.4 fL (ref 80.0–100.0)
Platelets: 244 10*3/uL (ref 150–400)
RBC: 4.84 MIL/uL (ref 3.87–5.11)
RDW: 13.4 % (ref 11.5–15.5)
WBC: 14.4 10*3/uL — ABNORMAL HIGH (ref 4.0–10.5)
nRBC: 0 % (ref 0.0–0.2)

## 2023-07-27 LAB — BASIC METABOLIC PANEL WITH GFR
Anion gap: 10 (ref 5–15)
BUN: 12 mg/dL (ref 6–20)
CO2: 24 mmol/L (ref 22–32)
Calcium: 9.4 mg/dL (ref 8.9–10.3)
Chloride: 104 mmol/L (ref 98–111)
Creatinine, Ser: 0.69 mg/dL (ref 0.44–1.00)
GFR, Estimated: >60 mL/min (ref >60)
Glucose, Bld: 96 mg/dL (ref 70–99)
Potassium: 3.9 mmol/L (ref 3.5–5.1)
Sodium: 138 mmol/L (ref 135–145)

## 2023-07-27 LAB — POC URINE PREG, ED: Preg Test, Ur: NEGATIVE

## 2023-07-27 LAB — D-DIMER, QUANTITATIVE: D-Dimer, Quant: <0.27 ug{FEU}/mL (ref 0.00–0.50)

## 2023-07-27 MED ORDER — ALUM & MAG HYDROXIDE-SIMETH 200-200-20 MG/5ML PO SUSP
30.0000 mL | Freq: Once | ORAL | Status: AC
  Administered 2023-07-27: 30 mL via ORAL
  Filled 2023-07-27: qty 30

## 2023-07-27 MED ORDER — LIDOCAINE VISCOUS HCL 2 % MT SOLN
15.0000 mL | Freq: Once | OROMUCOSAL | Status: AC
  Administered 2023-07-27: 15 mL via OROMUCOSAL
  Filled 2023-07-27: qty 15

## 2023-07-27 MED ORDER — KETOROLAC TROMETHAMINE 15 MG/ML IJ SOLN
15.0000 mg | Freq: Once | INTRAMUSCULAR | Status: AC
  Administered 2023-07-27: 15 mg via INTRAVENOUS
  Filled 2023-07-27: qty 1

## 2023-07-27 NOTE — Discharge Instructions (Addendum)
 Call today make an appointment with Dr. Kevan Peers,  who is your primary care provider.  It may be several weeks before he has an open appointment for follow-up of the ED visit you had today.  Lab work was great.  There were some small changes in your EKG that Dr. Kevan Peers needs to review and he may also refer you to cardiology.  Continue with your regular medications.  Dr. Custovic is the cardiologist on-call and you can call his office and also make an appointment for follow-up.  Read the information about chest wall pain.  Return to the emergency department if any severe worsening of your symptoms.

## 2023-07-27 NOTE — ED Triage Notes (Signed)
 Patient to ED via POV for centralized CP. Started when she woke up this AM. States sharp pain when inhaling. States she took tums and Pepto earlier this AM with no relief. Reports be SOB. Denies cardiac hx.

## 2023-07-27 NOTE — ED Provider Notes (Signed)
 [facility] Provider Note    Event Date/Time   First MD Initiated Contact with Patient 07/27/23 [phone]     (approximate)   History   Chest Pain   HPI  Samantha Davies is a 28 y.o. female   presents to the ED with complaint of midsternal chest pain that woke her up this morning.  Patient states that this is similar to the pain that she experienced when she was seen in the emergency department March of this year.  She denies any fever, chills, cough, congestion or vomiting.  She did have some nausea prior to arrival which has resolved at this time.  She reports taking Tums and Pepto earlier without relief.  Patient denies any cardiac history.  Patient flew to [facility] in April during spring break.  Patient reports vaping, daily marijuana use, and has an IUD.  No prior DVTs.  Patient has history of systemic lupus, UTIs, migraine, joint pain, major depressive disorder and anxiety.      Physical Exam   Triage Vital Signs: ED Triage Vitals  Encounter Vitals Group     BP 07/27/23 0740 105/78     Systolic BP Percentile --      Diastolic BP Percentile --      Pulse Rate 07/27/23 0740 63     Resp 07/27/23 0740 17     Temp 07/27/23 0740 98.5 F (36.9 C)     Temp Source 07/27/23 0740 Oral     SpO2 07/27/23 0740 96 %     Weight 07/27/23 0739 125 lb (56.7 kg)     Height 07/27/23 0739 5\' 1"  (1.549 m)     Head Circumference --      Peak Flow --      Pain Score 07/27/23 0739 9     Pain Loc --      Pain Education --      Exclude from Growth Chart --     Most recent vital signs: Vitals:   07/27/23 0740 07/27/23 1140  BP: 105/78 112/67  Pulse: 63 (!) 108  Resp: 17 18  Temp: 98.5 F (36.9 C) 99.5 F (37.5 C)  SpO2: 96% 98%     General: Awake, no distress.  Able to speak in complete sentences without any difficulty.  Speech is normal. CV:  Good peripheral perfusion.  Heart regular rate rhythm. Resp:  Normal effort.  Lungs clear bilaterally.  No point  tenderness on palpation of anterior chest wall. Abd:  No distention.  Soft, nontender, bowel sounds present x 4 quadrants. Other:     ED Results / Procedures / Treatments   Labs (all labs ordered are listed, but only abnormal results are displayed) Labs Reviewed  CBC - Abnormal; Notable for the following components:      Result Value   WBC 14.4 (*)    All other components within normal limits  BASIC METABOLIC PANEL WITH GFR  D-DIMER, QUANTITATIVE  POC URINE PREG, ED  TROPONIN I (HIGH SENSITIVITY)  TROPONIN I (HIGH SENSITIVITY)     EKG  Vent. rate 98 BPM PR interval 146 ms QRS duration 68 ms QT/QTcB 328/418 ms P-R-T axes 41 96 -2 Normal sinus rhythm Rightward axis T wave abnormality, consider inferior ischemia Abnormal ECG When compared with ECG of 16-May-2023 13:22, ST no longer elevated in Inferior leads Inverted T waves have replaced nonspecific T wave abnormality in Inferior leads T wave inversion now evident in Lateral leads Confirmed by UNCONFIRMED, DOCTOR ([id number]), editor Donnita Gales,  Shelvy Dickens [phone]) on 07/27/2023 7:57:22 AM   RADIOLOGY Chest x-ray images reviewed by myself and also radiology report reviewed which was negative for acute cardiopulmonary changes.    PROCEDURES:  Critical Care performed:   Procedures   MEDICATIONS ORDERED IN ED: Medications  alum & mag hydroxide-simeth (MAALOX/MYLANTA) 200-200-20 MG/5ML suspension 30 mL (30 mLs Oral Given 07/27/23 1214)  lidocaine  (XYLOCAINE ) 2 % viscous mouth solution 15 mL (15 mLs Mouth/Throat Given 07/27/23 1214)  ketorolac  (TORADOL ) 15 MG/ML injection 15 mg (15 mg Intravenous Given 07/27/23 1219)     IMPRESSION / MDM / ASSESSMENT AND PLAN / ED COURSE  I reviewed the triage vital signs and the nursing notes.   Differential diagnosis includes, but is not limited to, cardiac event, MI, nonspecific chest pain, pneumonia, bronchitis, acute exacerbation of GERD, esophagitis, PE.  28 year old female presents to the  ED with complaint of chest pain that occurred this morning when she was waking up.  Patient denies any cardiac history.  There were some EKG findings that were different than when she was in the emergency department in March when she also had chest pain.  Patient has some T wave abnormalities that were not present previously.  Dr. Felipe Horton also reviewed this EKG.  Patient states this pain is similar to what she had in March.  Chest x-ray was negative and reassuring as was her D-dimer and troponin x 2.  Basic labs included MetB and CBC.  Pregnancy test was negative.  Patient was given Toradol  15 mg IV and Maalox with lidocaine  for reflux after her cardiac workup was negative.  I discussed with patient that she should follow-up with he PCP who is Dr.Hande and also Dr. Braxton Calico who also is at [facility] and on-call for cardiology.      Patient's presentation is most consistent with acute presentation with potential threat to life or bodily function.  FINAL CLINICAL IMPRESSION(S) / ED DIAGNOSES   Final diagnoses:  Chest pain, unspecified type     Rx / DC Orders   ED Discharge Orders          Ordered    Ambulatory referral to Cardiology       Comments: If you have not heard from the Cardiology office within the next 72 hours please call [phone].   Pending             Note:  This document was prepared using Dragon voice recognition software and may include unintentional dictation errors.   Stafford Eagles, PA-C 07/27/23 1523    Arline Bennett, MD 07/28/23 [phone]
